# Patient Record
Sex: Female | Born: 1970 | Race: White | Hispanic: No | Marital: Married | Smoking: Never smoker
Health system: Southern US, Community
[De-identification: ages and names within clinical notes are randomized; demographics above are authoritative.]

## PROBLEM LIST (undated history)

## (undated) DIAGNOSIS — R0602 Shortness of breath: Secondary | ICD-10-CM

## (undated) DIAGNOSIS — D509 Iron deficiency anemia, unspecified: Secondary | ICD-10-CM

## (undated) DIAGNOSIS — R609 Edema, unspecified: Secondary | ICD-10-CM

## (undated) DIAGNOSIS — R140 Abdominal distension (gaseous): Secondary | ICD-10-CM

## (undated) DIAGNOSIS — Z8719 Personal history of other diseases of the digestive system: Secondary | ICD-10-CM

## (undated) DIAGNOSIS — D689 Coagulation defect, unspecified: Secondary | ICD-10-CM

## (undated) DIAGNOSIS — Z86711 Personal history of pulmonary embolism: Secondary | ICD-10-CM

## (undated) DIAGNOSIS — R110 Nausea: Secondary | ICD-10-CM

## (undated) DIAGNOSIS — E785 Hyperlipidemia, unspecified: Secondary | ICD-10-CM

## (undated) DIAGNOSIS — E039 Hypothyroidism, unspecified: Secondary | ICD-10-CM

## (undated) DIAGNOSIS — M7989 Other specified soft tissue disorders: Secondary | ICD-10-CM

## (undated) DIAGNOSIS — K439 Ventral hernia without obstruction or gangrene: Secondary | ICD-10-CM

## (undated) HISTORY — DX: Edema, unspecified: R60.9

## (undated) HISTORY — DX: Personal history of pulmonary embolism: Z86.711

## (undated) HISTORY — DX: Nausea: R11.0

## (undated) HISTORY — DX: Hyperlipidemia, unspecified: E78.5

## (undated) HISTORY — DX: Iron deficiency anemia, unspecified: D50.9

## (undated) HISTORY — DX: Ventral hernia without obstruction or gangrene: K43.9

## (undated) HISTORY — DX: Other specified soft tissue disorders: M79.89

## (undated) HISTORY — PX: UMBILICAL HERNIA REPAIR: SUR1181

## (undated) HISTORY — DX: Abdominal distension (gaseous): R14.0

## (undated) HISTORY — DX: Coagulation defect, unspecified: D68.9

## (undated) HISTORY — DX: Hypothyroidism, unspecified: E03.9

---

## 1997-12-27 ENCOUNTER — Ambulatory Visit (HOSPITAL_COMMUNITY): Admission: RE | Admit: 1997-12-27 | Discharge: 1997-12-27 | Payer: Self-pay | Admitting: *Deleted

## 1998-03-22 ENCOUNTER — Ambulatory Visit (HOSPITAL_COMMUNITY): Admission: RE | Admit: 1998-03-22 | Discharge: 1998-03-22 | Payer: Self-pay | Admitting: *Deleted

## 1998-12-26 ENCOUNTER — Other Ambulatory Visit: Admission: RE | Admit: 1998-12-26 | Discharge: 1998-12-26 | Payer: Self-pay | Admitting: *Deleted

## 2000-01-05 ENCOUNTER — Other Ambulatory Visit: Admission: RE | Admit: 2000-01-05 | Discharge: 2000-01-05 | Payer: Self-pay | Admitting: *Deleted

## 2000-12-17 ENCOUNTER — Other Ambulatory Visit: Admission: RE | Admit: 2000-12-17 | Discharge: 2000-12-17 | Payer: Self-pay | Admitting: *Deleted

## 2001-08-17 ENCOUNTER — Emergency Department (HOSPITAL_COMMUNITY): Admission: EM | Admit: 2001-08-17 | Discharge: 2001-08-17 | Payer: Self-pay

## 2001-08-25 ENCOUNTER — Encounter (INDEPENDENT_AMBULATORY_CARE_PROVIDER_SITE_OTHER): Payer: Self-pay | Admitting: Specialist

## 2001-08-25 ENCOUNTER — Encounter: Payer: Self-pay | Admitting: General Surgery

## 2001-08-25 ENCOUNTER — Ambulatory Visit (HOSPITAL_COMMUNITY): Admission: RE | Admit: 2001-08-25 | Discharge: 2001-08-26 | Payer: Self-pay | Admitting: General Surgery

## 2001-09-28 HISTORY — PX: CHOLECYSTECTOMY: SHX55

## 2001-10-03 DIAGNOSIS — Z86711 Personal history of pulmonary embolism: Secondary | ICD-10-CM

## 2001-10-03 HISTORY — DX: Personal history of pulmonary embolism: Z86.711

## 2001-10-06 ENCOUNTER — Ambulatory Visit (HOSPITAL_COMMUNITY): Admission: RE | Admit: 2001-10-06 | Discharge: 2001-10-06 | Payer: Self-pay | Admitting: Endocrinology

## 2001-10-07 ENCOUNTER — Encounter: Payer: Self-pay | Admitting: Endocrinology

## 2001-10-17 ENCOUNTER — Encounter: Payer: Self-pay | Admitting: Endocrinology

## 2001-10-17 ENCOUNTER — Ambulatory Visit (HOSPITAL_COMMUNITY): Admission: RE | Admit: 2001-10-17 | Discharge: 2001-10-17 | Payer: Self-pay | Admitting: Endocrinology

## 2001-10-25 ENCOUNTER — Encounter: Payer: Self-pay | Admitting: Emergency Medicine

## 2001-10-25 ENCOUNTER — Encounter (INDEPENDENT_AMBULATORY_CARE_PROVIDER_SITE_OTHER): Payer: Self-pay | Admitting: *Deleted

## 2001-10-25 ENCOUNTER — Inpatient Hospital Stay (HOSPITAL_COMMUNITY): Admission: EM | Admit: 2001-10-25 | Discharge: 2001-11-04 | Payer: Self-pay | Admitting: Nurse Practitioner

## 2001-10-26 ENCOUNTER — Encounter: Payer: Self-pay | Admitting: Family Medicine

## 2001-10-28 ENCOUNTER — Encounter: Payer: Self-pay | Admitting: Thoracic Surgery

## 2001-11-19 ENCOUNTER — Encounter: Payer: Self-pay | Admitting: Thoracic Surgery

## 2001-11-19 ENCOUNTER — Encounter: Admission: RE | Admit: 2001-11-19 | Discharge: 2001-11-19 | Payer: Self-pay | Admitting: Thoracic Surgery

## 2002-01-12 ENCOUNTER — Encounter: Admission: RE | Admit: 2002-01-12 | Discharge: 2002-01-12 | Payer: Self-pay | Admitting: Thoracic Surgery

## 2002-01-12 ENCOUNTER — Encounter: Payer: Self-pay | Admitting: Thoracic Surgery

## 2002-04-10 ENCOUNTER — Other Ambulatory Visit: Admission: RE | Admit: 2002-04-10 | Discharge: 2002-04-10 | Payer: Self-pay | Admitting: *Deleted

## 2003-01-15 ENCOUNTER — Encounter: Payer: Self-pay | Admitting: *Deleted

## 2003-01-15 ENCOUNTER — Encounter: Admission: RE | Admit: 2003-01-15 | Discharge: 2003-01-15 | Payer: Self-pay | Admitting: *Deleted

## 2003-03-09 ENCOUNTER — Encounter: Admission: RE | Admit: 2003-03-09 | Discharge: 2003-03-09 | Payer: Self-pay | Admitting: *Deleted

## 2003-03-19 ENCOUNTER — Inpatient Hospital Stay (HOSPITAL_COMMUNITY): Admission: AD | Admit: 2003-03-19 | Discharge: 2003-03-19 | Payer: Self-pay | Admitting: *Deleted

## 2003-04-26 ENCOUNTER — Inpatient Hospital Stay (HOSPITAL_COMMUNITY): Admission: AD | Admit: 2003-04-26 | Discharge: 2003-04-29 | Payer: Self-pay | Admitting: Obstetrics and Gynecology

## 2003-05-31 ENCOUNTER — Other Ambulatory Visit: Admission: RE | Admit: 2003-05-31 | Discharge: 2003-05-31 | Payer: Self-pay | Admitting: *Deleted

## 2004-03-13 ENCOUNTER — Ambulatory Visit: Payer: Self-pay | Admitting: Oncology

## 2004-05-08 ENCOUNTER — Ambulatory Visit: Payer: Self-pay | Admitting: Oncology

## 2004-06-26 ENCOUNTER — Ambulatory Visit: Payer: Self-pay | Admitting: Oncology

## 2004-08-21 ENCOUNTER — Ambulatory Visit: Payer: Self-pay | Admitting: Oncology

## 2004-10-02 ENCOUNTER — Other Ambulatory Visit: Admission: RE | Admit: 2004-10-02 | Discharge: 2004-10-02 | Payer: Self-pay | Admitting: Obstetrics and Gynecology

## 2004-10-16 ENCOUNTER — Ambulatory Visit: Payer: Self-pay | Admitting: Oncology

## 2004-12-25 ENCOUNTER — Ambulatory Visit: Payer: Self-pay | Admitting: Oncology

## 2005-02-19 ENCOUNTER — Ambulatory Visit: Payer: Self-pay | Admitting: Oncology

## 2005-04-19 ENCOUNTER — Ambulatory Visit: Payer: Self-pay | Admitting: Oncology

## 2005-06-25 ENCOUNTER — Ambulatory Visit: Payer: Self-pay | Admitting: Oncology

## 2005-09-07 ENCOUNTER — Ambulatory Visit: Payer: Self-pay | Admitting: Oncology

## 2005-09-11 LAB — CBC WITH DIFFERENTIAL/PLATELET
BASO%: 0.9 % (ref 0.0–2.0)
Basophils Absolute: 0.1 10*3/uL (ref 0.0–0.1)
EOS%: 4.3 % (ref 0.0–7.0)
HGB: 12.9 g/dL (ref 11.6–15.9)
MCH: 28.1 pg (ref 26.0–34.0)
MCHC: 35.6 g/dL (ref 32.0–36.0)
RDW: 13.4 % (ref 11.3–14.5)
lymph#: 2.2 10*3/uL (ref 0.9–3.3)

## 2005-09-11 LAB — PROTIME-INR: INR: 1.5 — ABNORMAL LOW (ref 2.00–3.50)

## 2005-10-03 LAB — PROTIME-INR: Protime: 18.1 Seconds — ABNORMAL HIGH (ref 10.6–13.4)

## 2005-10-22 ENCOUNTER — Ambulatory Visit: Payer: Self-pay | Admitting: Oncology

## 2005-10-25 LAB — PROTIME-INR: INR: 2 (ref 2.00–3.50)

## 2005-10-28 ENCOUNTER — Emergency Department (HOSPITAL_COMMUNITY): Admission: EM | Admit: 2005-10-28 | Discharge: 2005-10-28 | Payer: Self-pay | Admitting: Emergency Medicine

## 2005-11-23 LAB — CBC WITH DIFFERENTIAL/PLATELET
BASO%: 1 % (ref 0.0–2.0)
MCHC: 33.4 g/dL (ref 32.0–36.0)
MONO#: 0.7 10*3/uL (ref 0.1–0.9)
RBC: 4.95 10*6/uL (ref 3.70–5.32)
WBC: 8.6 10*3/uL (ref 3.9–10.0)
lymph#: 2.2 10*3/uL (ref 0.9–3.3)

## 2005-11-23 LAB — PROTIME-INR: Protime: 18.9 Seconds — ABNORMAL HIGH (ref 10.6–13.4)

## 2005-12-14 ENCOUNTER — Ambulatory Visit: Payer: Self-pay | Admitting: Oncology

## 2005-12-21 LAB — CBC WITH DIFFERENTIAL/PLATELET
Basophils Absolute: 0.1 10*3/uL (ref 0.0–0.1)
EOS%: 4.6 % (ref 0.0–7.0)
Eosinophils Absolute: 0.5 10*3/uL (ref 0.0–0.5)
LYMPH%: 20.6 % (ref 14.0–48.0)
MCH: 26.7 pg (ref 26.0–34.0)
MCV: 78.4 fL — ABNORMAL LOW (ref 81.0–101.0)
MONO%: 6.4 % (ref 0.0–13.0)
NEUT#: 7.4 10*3/uL — ABNORMAL HIGH (ref 1.5–6.5)
Platelets: 341 10*3/uL (ref 145–400)
RBC: 5.08 10*6/uL (ref 3.70–5.32)
RDW: 16.4 % — ABNORMAL HIGH (ref 11.3–14.5)

## 2005-12-21 LAB — D-DIMER, QUANTITATIVE: D-Dimer, Quant: 0.1 ug/mL-FEU (ref 0.00–0.48)

## 2005-12-21 LAB — PROTHROMBIN TIME: Prothrombin Time: 15.5 seconds — ABNORMAL HIGH (ref 11.6–15.2)

## 2005-12-28 LAB — PROTIME-INR: INR: 1.7 — ABNORMAL LOW (ref 2.00–3.50)

## 2006-01-21 LAB — CBC WITH DIFFERENTIAL/PLATELET
Basophils Absolute: 0.1 10*3/uL (ref 0.0–0.1)
EOS%: 4.1 % (ref 0.0–7.0)
Eosinophils Absolute: 0.4 10*3/uL (ref 0.0–0.5)
HGB: 14 g/dL (ref 11.6–15.9)
LYMPH%: 24.3 % (ref 14.0–48.0)
MCH: 26.5 pg (ref 26.0–34.0)
MCV: 76.2 fL — ABNORMAL LOW (ref 81.0–101.0)
MONO%: 5.9 % (ref 0.0–13.0)
Platelets: 354 10*3/uL (ref 145–400)
RDW: 13.1 % (ref 11.3–14.5)

## 2006-01-21 LAB — PROTIME-INR: INR: 2 (ref 2.00–3.50)

## 2006-02-13 ENCOUNTER — Ambulatory Visit: Payer: Self-pay | Admitting: Oncology

## 2006-03-18 LAB — CBC WITH DIFFERENTIAL/PLATELET
EOS%: 4 % (ref 0.0–7.0)
LYMPH%: 24.2 % (ref 14.0–48.0)
MCH: 26.4 pg (ref 26.0–34.0)
MCHC: 33.2 g/dL (ref 32.0–36.0)
MCV: 79.4 fL — ABNORMAL LOW (ref 81.0–101.0)
MONO%: 6.6 % (ref 0.0–13.0)
Platelets: 289 10*3/uL (ref 145–400)
RBC: 5.27 10*6/uL (ref 3.70–5.32)
RDW: 12.9 % (ref 11.3–14.5)

## 2006-03-18 LAB — PROTIME-INR
INR: 1.3 — ABNORMAL LOW (ref 2.00–3.50)
Protime: 15.6 Seconds — ABNORMAL HIGH (ref 10.6–13.4)

## 2006-03-29 ENCOUNTER — Ambulatory Visit: Payer: Self-pay | Admitting: Oncology

## 2006-04-02 LAB — PROTIME-INR: Protime: 18 Seconds — ABNORMAL HIGH (ref 10.6–13.4)

## 2006-04-16 LAB — PROTIME-INR
INR: 3.4 (ref 2.00–3.50)
Protime: 40.8 Seconds — ABNORMAL HIGH (ref 10.6–13.4)

## 2006-04-29 LAB — PROTIME-INR: INR: 2.8 (ref 2.00–3.50)

## 2006-05-16 ENCOUNTER — Ambulatory Visit: Payer: Self-pay | Admitting: Oncology

## 2006-05-21 LAB — CBC WITH DIFFERENTIAL/PLATELET
BASO%: 0.7 % (ref 0.0–2.0)
Basophils Absolute: 0.1 10*3/uL (ref 0.0–0.1)
EOS%: 4.1 % (ref 0.0–7.0)
Eosinophils Absolute: 0.4 10*3/uL (ref 0.0–0.5)
HCT: 40.9 % (ref 34.8–46.6)
HGB: 14 g/dL (ref 11.6–15.9)
LYMPH%: 22.8 % (ref 14.0–48.0)
MCH: 27.5 pg (ref 26.0–34.0)
MCHC: 34.3 g/dL (ref 32.0–36.0)
MCV: 80 fL — ABNORMAL LOW (ref 81.0–101.0)
MONO#: 0.5 10*3/uL (ref 0.1–0.9)
MONO%: 5.9 % (ref 0.0–13.0)
NEUT#: 6.1 10*3/uL (ref 1.5–6.5)
NEUT%: 66.5 % (ref 39.6–76.8)
Platelets: 299 10*3/uL (ref 145–400)
RBC: 5.12 10*6/uL (ref 3.70–5.32)
RDW: 15.6 % — ABNORMAL HIGH (ref 11.3–14.5)
WBC: 9.2 10*3/uL (ref 3.9–10.0)
lymph#: 2.1 10*3/uL (ref 0.9–3.3)

## 2006-05-21 LAB — PROTIME-INR
INR: 1.5 — ABNORMAL LOW (ref 2.00–3.50)
Protime: 18 Seconds — ABNORMAL HIGH (ref 10.6–13.4)

## 2006-05-28 LAB — PROTIME-INR
INR: 2.8 (ref 2.00–3.50)
Protime: 33.6 Seconds — ABNORMAL HIGH (ref 10.6–13.4)

## 2006-07-10 ENCOUNTER — Ambulatory Visit: Payer: Self-pay | Admitting: Oncology

## 2006-07-15 LAB — COMPREHENSIVE METABOLIC PANEL
ALT: 26 U/L (ref 0–35)
Alkaline Phosphatase: 78 U/L (ref 39–117)
CO2: 23 mEq/L (ref 19–32)
Creatinine, Ser: 0.7 mg/dL (ref 0.40–1.20)
Sodium: 137 mEq/L (ref 135–145)
Total Bilirubin: 0.7 mg/dL (ref 0.3–1.2)
Total Protein: 7.5 g/dL (ref 6.0–8.3)

## 2006-07-15 LAB — CBC WITH DIFFERENTIAL/PLATELET
BASO%: 0.6 % (ref 0.0–2.0)
EOS%: 3.5 % (ref 0.0–7.0)
HCT: 40.3 % (ref 34.8–46.6)
MCH: 27.3 pg (ref 26.0–34.0)
MCHC: 34.4 g/dL (ref 32.0–36.0)
MONO#: 0.6 10*3/uL (ref 0.1–0.9)
RBC: 5.08 10*6/uL (ref 3.70–5.32)
RDW: 15.6 % — ABNORMAL HIGH (ref 11.3–14.5)
WBC: 10 10*3/uL (ref 3.9–10.0)
lymph#: 2.3 10*3/uL (ref 0.9–3.3)

## 2006-07-15 LAB — PROTIME-INR: Protime: 25.2 Seconds — ABNORMAL HIGH (ref 10.6–13.4)

## 2006-08-06 LAB — CBC WITH DIFFERENTIAL/PLATELET
BASO%: 1.3 % (ref 0.0–2.0)
Basophils Absolute: 0.1 10*3/uL (ref 0.0–0.1)
EOS%: 3.7 % (ref 0.0–7.0)
Eosinophils Absolute: 0.4 10*3/uL (ref 0.0–0.5)
HCT: 40.3 % (ref 34.8–46.6)
HGB: 14.2 g/dL (ref 11.6–15.9)
LYMPH%: 22.1 % (ref 14.0–48.0)
MCH: 28.1 pg (ref 26.0–34.0)
MCHC: 35.3 g/dL (ref 32.0–36.0)
MCV: 79.8 fL — ABNORMAL LOW (ref 81.0–101.0)
MONO#: 0.6 10*3/uL (ref 0.1–0.9)
MONO%: 6.3 % (ref 0.0–13.0)
NEUT#: 6.5 10*3/uL (ref 1.5–6.5)
NEUT%: 66.6 % (ref 39.6–76.8)
Platelets: 300 10*3/uL (ref 145–400)
RBC: 5.06 10*6/uL (ref 3.70–5.32)
RDW: 15.2 % — ABNORMAL HIGH (ref 11.3–14.5)
WBC: 9.7 10*3/uL (ref 3.9–10.0)
lymph#: 2.2 10*3/uL (ref 0.9–3.3)

## 2006-08-09 LAB — HEPARIN ANTI-XA: Heparin LMW: 0.89 IU/mL

## 2006-09-05 ENCOUNTER — Ambulatory Visit: Payer: Self-pay | Admitting: Oncology

## 2006-09-05 LAB — CBC WITH DIFFERENTIAL/PLATELET
BASO%: 1.1 % (ref 0.0–2.0)
Eosinophils Absolute: 0.3 10*3/uL (ref 0.0–0.5)
HCT: 40.4 % (ref 34.8–46.6)
LYMPH%: 23 % (ref 14.0–48.0)
MONO#: 0.6 10*3/uL (ref 0.1–0.9)
NEUT#: 6.7 10*3/uL — ABNORMAL HIGH (ref 1.5–6.5)
Platelets: 317 10*3/uL (ref 145–400)
RBC: 5.04 10*6/uL (ref 3.70–5.32)
WBC: 10 10*3/uL (ref 3.9–10.0)
lymph#: 2.3 10*3/uL (ref 0.9–3.3)

## 2006-09-05 LAB — HEPARIN ANTI-XA: Heparin LMW: 0.93 IU/mL

## 2006-10-03 LAB — CBC WITH DIFFERENTIAL/PLATELET
Basophils Absolute: 0 10*3/uL (ref 0.0–0.1)
EOS%: 4.8 % (ref 0.0–7.0)
Eosinophils Absolute: 0.4 10*3/uL (ref 0.0–0.5)
HCT: 38.3 % (ref 34.8–46.6)
HGB: 13.5 g/dL (ref 11.6–15.9)
MCH: 28.3 pg (ref 26.0–34.0)
MONO#: 0.6 10*3/uL (ref 0.1–0.9)
NEUT#: 5.8 10*3/uL (ref 1.5–6.5)
NEUT%: 63.7 % (ref 39.6–76.8)
RDW: 14.1 % (ref 11.3–14.5)
lymph#: 2.2 10*3/uL (ref 0.9–3.3)

## 2006-11-07 ENCOUNTER — Ambulatory Visit: Payer: Self-pay | Admitting: Oncology

## 2006-11-11 LAB — CBC WITH DIFFERENTIAL/PLATELET
EOS%: 4.2 % (ref 0.0–7.0)
Eosinophils Absolute: 0.4 10*3/uL (ref 0.0–0.5)
HGB: 13.7 g/dL (ref 11.6–15.9)
MCV: 80.7 fL — ABNORMAL LOW (ref 81.0–101.0)
MONO%: 5.3 % (ref 0.0–13.0)
NEUT#: 6.6 10*3/uL — ABNORMAL HIGH (ref 1.5–6.5)
RBC: 4.86 10*6/uL (ref 3.70–5.32)
RDW: 14.1 % (ref 11.3–14.5)
lymph#: 2.4 10*3/uL (ref 0.9–3.3)

## 2006-11-12 LAB — HEPARIN ANTI-XA: Heparin LMW: 0.71 IU/mL

## 2006-12-24 ENCOUNTER — Ambulatory Visit: Payer: Self-pay | Admitting: Oncology

## 2007-01-01 LAB — CBC WITH DIFFERENTIAL/PLATELET
BASO%: 0.5 % (ref 0.0–2.0)
Eosinophils Absolute: 0.4 10*3/uL (ref 0.0–0.5)
HCT: 39.2 % (ref 34.8–46.6)
MCHC: 35.5 g/dL (ref 32.0–36.0)
MONO#: 0.5 10*3/uL (ref 0.1–0.9)
NEUT#: 5.8 10*3/uL (ref 1.5–6.5)
RBC: 4.89 10*6/uL (ref 3.70–5.32)
WBC: 9 10*3/uL (ref 3.9–10.0)
lymph#: 2.4 10*3/uL (ref 0.9–3.3)

## 2007-01-01 LAB — HEPARIN ANTI-XA: Heparin LMW: 0.99 IU/mL

## 2007-01-20 LAB — CBC WITH DIFFERENTIAL/PLATELET
Basophils Absolute: 0 10*3/uL (ref 0.0–0.1)
EOS%: 4 % (ref 0.0–7.0)
Eosinophils Absolute: 0.4 10*3/uL (ref 0.0–0.5)
HGB: 13.9 g/dL (ref 11.6–15.9)
MONO#: 0.6 10*3/uL (ref 0.1–0.9)
NEUT#: 5.6 10*3/uL (ref 1.5–6.5)
RDW: 14.5 % (ref 11.3–14.5)
lymph#: 2.5 10*3/uL (ref 0.9–3.3)

## 2007-02-13 ENCOUNTER — Ambulatory Visit: Payer: Self-pay | Admitting: Oncology

## 2007-02-17 LAB — CBC WITH DIFFERENTIAL/PLATELET
BASO%: 0.6 % (ref 0.0–2.0)
Basophils Absolute: 0.1 10*3/uL (ref 0.0–0.1)
EOS%: 4.2 % (ref 0.0–7.0)
HCT: 40.8 % (ref 34.8–46.6)
HGB: 14.4 g/dL (ref 11.6–15.9)
MCH: 28.2 pg (ref 26.0–34.0)
MCHC: 35.3 g/dL (ref 32.0–36.0)
MCV: 79.9 fL — ABNORMAL LOW (ref 81.0–101.0)
MONO%: 5.4 % (ref 0.0–13.0)
NEUT%: 61.1 % (ref 39.6–76.8)
RDW: 14.4 % (ref 11.3–14.5)

## 2007-03-17 LAB — HEPARIN ANTI-XA: Heparin LMW: 0.72 IU/mL

## 2007-03-17 LAB — CBC WITH DIFFERENTIAL/PLATELET
BASO%: 0.6 % (ref 0.0–2.0)
EOS%: 3.1 % (ref 0.0–7.0)
LYMPH%: 26.1 % (ref 14.0–48.0)
MCHC: 35.5 g/dL (ref 32.0–36.0)
MCV: 80.6 fL — ABNORMAL LOW (ref 81.0–101.0)
MONO%: 4.9 % (ref 0.0–13.0)
Platelets: 294 10*3/uL (ref 145–400)
RBC: 4.95 10*6/uL (ref 3.70–5.32)

## 2007-04-10 ENCOUNTER — Ambulatory Visit: Payer: Self-pay | Admitting: Oncology

## 2007-04-14 LAB — CBC WITH DIFFERENTIAL/PLATELET
BASO%: 0.7 % (ref 0.0–2.0)
EOS%: 3.4 % (ref 0.0–7.0)
MCH: 28.3 pg (ref 26.0–34.0)
MCHC: 34.8 g/dL (ref 32.0–36.0)
NEUT%: 69.8 % (ref 39.6–76.8)
RBC: 5.02 10*6/uL (ref 3.70–5.32)
RDW: 14.9 % — ABNORMAL HIGH (ref 11.3–14.5)
lymph#: 2.1 10*3/uL (ref 0.9–3.3)

## 2007-04-14 LAB — HEPARIN ANTI-XA: Heparin LMW: 0.79 IU/mL

## 2007-06-16 ENCOUNTER — Ambulatory Visit: Payer: Self-pay | Admitting: Oncology

## 2007-06-18 LAB — CBC WITH DIFFERENTIAL/PLATELET
BASO%: 0.5 % (ref 0.0–2.0)
EOS%: 4.9 % (ref 0.0–7.0)
LYMPH%: 28 % (ref 14.0–48.0)
MCHC: 35 g/dL (ref 32.0–36.0)
MCV: 81.7 fL (ref 81.0–101.0)
MONO%: 5.9 % (ref 0.0–13.0)
Platelets: 284 10*3/uL (ref 145–400)
RBC: 5.11 10*6/uL (ref 3.70–5.32)
RDW: 14.4 % (ref 11.3–14.5)
WBC: 7.9 10*3/uL (ref 3.9–10.0)

## 2007-07-30 ENCOUNTER — Ambulatory Visit: Payer: Self-pay | Admitting: Oncology

## 2007-08-06 LAB — PROTIME-INR: Protime: 20.4 Seconds — ABNORMAL HIGH (ref 10.6–13.4)

## 2007-08-13 LAB — PROTIME-INR
INR: 1.9 — ABNORMAL LOW (ref 2.00–3.50)
Protime: 22.8 Seconds — ABNORMAL HIGH (ref 10.6–13.4)

## 2007-08-18 ENCOUNTER — Ambulatory Visit (HOSPITAL_COMMUNITY): Admission: RE | Admit: 2007-08-18 | Discharge: 2007-08-18 | Payer: Self-pay | Admitting: Obstetrics and Gynecology

## 2007-08-29 LAB — PROTIME-INR
INR: 3.7 — ABNORMAL HIGH (ref 2.00–3.50)
Protime: 44.4 Seconds — ABNORMAL HIGH (ref 10.6–13.4)

## 2007-09-10 ENCOUNTER — Ambulatory Visit: Payer: Self-pay | Admitting: Oncology

## 2007-09-12 LAB — CBC WITH DIFFERENTIAL/PLATELET
BASO%: 1.1 % (ref 0.0–2.0)
Basophils Absolute: 0.1 10*3/uL (ref 0.0–0.1)
EOS%: 5.5 % (ref 0.0–7.0)
HCT: 41.5 % (ref 34.8–46.6)
HGB: 14.1 g/dL (ref 11.6–15.9)
LYMPH%: 23.1 % (ref 14.0–48.0)
MCH: 27.4 pg (ref 26.0–34.0)
MCHC: 34 g/dL (ref 32.0–36.0)
NEUT%: 64.3 % (ref 39.6–76.8)
Platelets: 276 10*3/uL (ref 145–400)
lymph#: 1.9 10*3/uL (ref 0.9–3.3)

## 2007-09-12 LAB — PROTIME-INR

## 2007-09-26 LAB — PROTIME-INR

## 2007-10-09 LAB — PROTIME-INR: INR: 3.4 (ref 2.00–3.50)

## 2007-10-22 ENCOUNTER — Ambulatory Visit: Payer: Self-pay | Admitting: Oncology

## 2007-10-24 LAB — PROTIME-INR: INR: 2.2 (ref 2.00–3.50)

## 2007-11-21 LAB — CBC WITH DIFFERENTIAL/PLATELET
BASO%: 0.7 % (ref 0.0–2.0)
EOS%: 4.2 % (ref 0.0–7.0)
LYMPH%: 25 % (ref 14.0–48.0)
MCH: 27.4 pg (ref 26.0–34.0)
MCHC: 34.3 g/dL (ref 32.0–36.0)
MCV: 80 fL — ABNORMAL LOW (ref 81.0–101.0)
MONO%: 7.2 % (ref 0.0–13.0)
Platelets: 295 10*3/uL (ref 145–400)
RBC: 4.95 10*6/uL (ref 3.70–5.32)
RDW: 14 % (ref 11.3–14.5)

## 2007-11-21 LAB — PROTIME-INR
INR: 3.1 (ref 2.00–3.50)
Protime: 37.2 Seconds — ABNORMAL HIGH (ref 10.6–13.4)

## 2007-12-14 ENCOUNTER — Ambulatory Visit: Payer: Self-pay | Admitting: Oncology

## 2007-12-18 LAB — PROTIME-INR
INR: 2.7 (ref 2.00–3.50)
Protime: 32.4 Seconds — ABNORMAL HIGH (ref 10.6–13.4)

## 2007-12-26 LAB — PROTIME-INR
INR: 4.2 — ABNORMAL HIGH (ref 2.00–3.50)
Protime: 50.4 Seconds — ABNORMAL HIGH (ref 10.6–13.4)

## 2008-01-16 LAB — PROTIME-INR

## 2008-01-21 ENCOUNTER — Encounter: Admission: RE | Admit: 2008-01-21 | Discharge: 2008-01-21 | Payer: Self-pay | Admitting: Family Medicine

## 2008-02-11 ENCOUNTER — Ambulatory Visit: Payer: Self-pay | Admitting: Oncology

## 2008-03-08 LAB — CBC WITH DIFFERENTIAL/PLATELET
Basophils Absolute: 0 10*3/uL (ref 0.0–0.1)
EOS%: 3.4 % (ref 0.0–7.0)
Eosinophils Absolute: 0.3 10*3/uL (ref 0.0–0.5)
HCT: 40.4 % (ref 34.8–46.6)
HGB: 14.1 g/dL (ref 11.6–15.9)
MCH: 29.1 pg (ref 26.0–34.0)
NEUT#: 6.2 10*3/uL (ref 1.5–6.5)
NEUT%: 68.6 % (ref 39.6–76.8)
lymph#: 2 10*3/uL (ref 0.9–3.3)

## 2008-03-08 LAB — HEPARIN ANTI-XA: Heparin LMW: 0.69 IU/mL

## 2008-03-09 LAB — COMPREHENSIVE METABOLIC PANEL
Albumin: 4.2 g/dL (ref 3.5–5.2)
BUN: 9 mg/dL (ref 6–23)
CO2: 21 mEq/L (ref 19–32)
Calcium: 8.8 mg/dL (ref 8.4–10.5)
Chloride: 103 mEq/L (ref 96–112)
Creatinine, Ser: 0.66 mg/dL (ref 0.40–1.20)
Glucose, Bld: 91 mg/dL (ref 70–99)

## 2008-03-09 LAB — LACTATE DEHYDROGENASE: LDH: 118 U/L (ref 94–250)

## 2008-03-09 LAB — D-DIMER, QUANTITATIVE: D-Dimer, Quant: 0.22 ug/mL-FEU (ref 0.00–0.48)

## 2008-03-09 LAB — FIBRINOGEN: Fibrinogen: 556 mg/dL — ABNORMAL HIGH (ref 204–475)

## 2008-04-01 ENCOUNTER — Ambulatory Visit: Payer: Self-pay | Admitting: Oncology

## 2008-04-05 LAB — CBC WITH DIFFERENTIAL/PLATELET
Eosinophils Absolute: 0.3 10*3/uL (ref 0.0–0.5)
MONO#: 0.5 10*3/uL (ref 0.1–0.9)
MONO%: 5.7 % (ref 0.0–13.0)
NEUT#: 6.7 10*3/uL — ABNORMAL HIGH (ref 1.5–6.5)
RBC: 5.17 10*6/uL (ref 3.70–5.32)
RDW: 15.1 % — ABNORMAL HIGH (ref 11.3–14.5)
WBC: 9.6 10*3/uL (ref 3.9–10.0)
lymph#: 2.1 10*3/uL (ref 0.9–3.3)

## 2008-04-05 LAB — HEPARIN ANTI-XA: Heparin LMW: 1.01 IU/mL

## 2008-05-03 ENCOUNTER — Ambulatory Visit: Payer: Self-pay | Admitting: Oncology

## 2008-05-03 LAB — PROTHROMBIN TIME
INR: 1 (ref 0.0–1.5)
Prothrombin Time: 13.4 seconds (ref 11.6–15.2)

## 2008-05-03 LAB — CBC WITH DIFFERENTIAL/PLATELET
Basophils Absolute: 0.1 10*3/uL (ref 0.0–0.1)
Eosinophils Absolute: 0.2 10*3/uL (ref 0.0–0.5)
HGB: 14.9 g/dL (ref 11.6–15.9)
MONO#: 0.4 10*3/uL (ref 0.1–0.9)
NEUT#: 6.8 10*3/uL — ABNORMAL HIGH (ref 1.5–6.5)
Platelets: 281 10*3/uL (ref 145–400)
RBC: 5.02 10*6/uL (ref 3.70–5.32)
RDW: 13.8 % (ref 11.3–14.5)
WBC: 9 10*3/uL (ref 3.9–10.0)

## 2008-05-03 LAB — HEPARIN ANTI-XA: Heparin LMW: 0.84 IU/mL

## 2008-05-31 LAB — CBC WITH DIFFERENTIAL/PLATELET
Basophils Absolute: 0 10*3/uL (ref 0.0–0.1)
Eosinophils Absolute: 0.2 10*3/uL (ref 0.0–0.5)
HCT: 40.6 % (ref 34.8–46.6)
HGB: 14.4 g/dL (ref 11.6–15.9)
MCH: 30.6 pg (ref 26.0–34.0)
MONO#: 0.5 10*3/uL (ref 0.1–0.9)
NEUT#: 8 10*3/uL — ABNORMAL HIGH (ref 1.5–6.5)
NEUT%: 77.1 % — ABNORMAL HIGH (ref 39.6–76.8)
WBC: 10.4 10*3/uL — ABNORMAL HIGH (ref 3.9–10.0)
lymph#: 1.6 10*3/uL (ref 0.9–3.3)

## 2008-06-24 ENCOUNTER — Ambulatory Visit: Payer: Self-pay | Admitting: Oncology

## 2008-07-01 LAB — CBC WITH DIFFERENTIAL/PLATELET
BASO%: 0.7 % (ref 0.0–2.0)
Eosinophils Absolute: 0.2 10*3/uL (ref 0.0–0.5)
HCT: 38.7 % (ref 34.8–46.6)
MCHC: 35.1 g/dL (ref 31.5–36.0)
MONO#: 0.4 10*3/uL (ref 0.1–0.9)
NEUT#: 7.1 10*3/uL — ABNORMAL HIGH (ref 1.5–6.5)
Platelets: 206 10*3/uL (ref 145–400)
RBC: 4.43 10*6/uL (ref 3.70–5.45)
WBC: 9.3 10*3/uL (ref 3.9–10.3)
lymph#: 1.6 10*3/uL (ref 0.9–3.3)

## 2008-07-01 LAB — PROTIME-INR: Protime: 12 Seconds (ref 10.6–13.4)

## 2008-07-26 LAB — CBC WITH DIFFERENTIAL/PLATELET
BASO%: 0.2 % (ref 0.0–2.0)
HCT: 39.7 % (ref 34.8–46.6)
LYMPH%: 14 % (ref 14.0–49.7)
MCHC: 34.9 g/dL (ref 31.5–36.0)
MONO#: 0.3 10*3/uL (ref 0.1–0.9)
NEUT%: 80.9 % — ABNORMAL HIGH (ref 38.4–76.8)
Platelets: 185 10*3/uL (ref 145–400)
WBC: 9.8 10*3/uL (ref 3.9–10.3)

## 2008-07-26 LAB — HEPARIN ANTI-XA: Heparin LMW: 0.6 IU/mL

## 2008-08-09 ENCOUNTER — Ambulatory Visit: Payer: Self-pay | Admitting: Oncology

## 2008-08-23 LAB — CBC WITH DIFFERENTIAL/PLATELET
Basophils Absolute: 0 10*3/uL (ref 0.0–0.1)
Eosinophils Absolute: 0.1 10*3/uL (ref 0.0–0.5)
HCT: 38 % (ref 34.8–46.6)
HGB: 13.4 g/dL (ref 11.6–15.9)
LYMPH%: 17.2 % (ref 14.0–49.7)
MCV: 87.2 fL (ref 79.5–101.0)
MONO#: 0.4 10*3/uL (ref 0.1–0.9)
MONO%: 5.4 % (ref 0.0–14.0)
NEUT#: 6.2 10*3/uL (ref 1.5–6.5)
Platelets: 190 10*3/uL (ref 145–400)
RBC: 4.36 10*6/uL (ref 3.70–5.45)
WBC: 8.3 10*3/uL (ref 3.9–10.3)

## 2008-08-23 LAB — HEPARIN ANTI-XA: Heparin LMW: 0.66 IU/mL

## 2008-08-23 LAB — PROTIME-INR: Protime: 10.8 Seconds (ref 10.6–13.4)

## 2008-08-24 ENCOUNTER — Encounter: Admission: RE | Admit: 2008-08-24 | Discharge: 2008-08-24 | Payer: Self-pay | Admitting: Obstetrics and Gynecology

## 2008-09-15 ENCOUNTER — Inpatient Hospital Stay (HOSPITAL_COMMUNITY): Admission: AD | Admit: 2008-09-15 | Discharge: 2008-09-15 | Payer: Self-pay | Admitting: Obstetrics and Gynecology

## 2008-09-20 LAB — CBC WITH DIFFERENTIAL/PLATELET
BASO%: 0.4 % (ref 0.0–2.0)
EOS%: 1.2 % (ref 0.0–7.0)
MCH: 30.1 pg (ref 25.1–34.0)
MCV: 87.3 fL (ref 79.5–101.0)
MONO%: 5.3 % (ref 0.0–14.0)
RBC: 4.65 10*6/uL (ref 3.70–5.45)
RDW: 14 % (ref 11.2–14.5)

## 2008-09-20 LAB — PROTIME-INR
INR: 0.9 — ABNORMAL LOW (ref 2.00–3.50)
Protime: 10.8 Seconds (ref 10.6–13.4)

## 2008-10-14 ENCOUNTER — Ambulatory Visit: Payer: Self-pay | Admitting: Oncology

## 2008-10-18 LAB — CBC WITH DIFFERENTIAL/PLATELET
Eosinophils Absolute: 0.1 10*3/uL (ref 0.0–0.5)
HCT: 43 % (ref 34.8–46.6)
LYMPH%: 21.8 % (ref 14.0–49.7)
MCV: 87 fL (ref 79.5–101.0)
MONO#: 0.3 10*3/uL (ref 0.1–0.9)
NEUT#: 5.8 10*3/uL (ref 1.5–6.5)
NEUT%: 72.5 % (ref 38.4–76.8)
Platelets: 177 10*3/uL (ref 145–400)
WBC: 8 10*3/uL (ref 3.9–10.3)

## 2008-10-21 ENCOUNTER — Inpatient Hospital Stay (HOSPITAL_COMMUNITY): Admission: AD | Admit: 2008-10-21 | Discharge: 2008-10-24 | Payer: Self-pay | Admitting: Obstetrics and Gynecology

## 2008-10-27 LAB — CBC WITH DIFFERENTIAL/PLATELET
EOS%: 2.5 % (ref 0.0–7.0)
Eosinophils Absolute: 0.2 10*3/uL (ref 0.0–0.5)
MCV: 86.1 fL (ref 79.5–101.0)
MONO%: 6.1 % (ref 0.0–14.0)
NEUT#: 5.5 10*3/uL (ref 1.5–6.5)
RBC: 3.82 10*6/uL (ref 3.70–5.45)
RDW: 14.5 % (ref 11.2–14.5)
lymph#: 1.6 10*3/uL (ref 0.9–3.3)

## 2008-10-27 LAB — PROTIME-INR
INR: 1.3 — ABNORMAL LOW (ref 2.00–3.50)
Protime: 15.6 Seconds — ABNORMAL HIGH (ref 10.6–13.4)

## 2008-11-02 LAB — CBC WITH DIFFERENTIAL/PLATELET
BASO%: 0.2 % (ref 0.0–2.0)
EOS%: 2.3 % (ref 0.0–7.0)
HCT: 37.3 % (ref 34.8–46.6)
MCH: 29 pg (ref 25.1–34.0)
MCHC: 34.3 g/dL (ref 31.5–36.0)
NEUT%: 67.7 % (ref 38.4–76.8)
RBC: 4.41 10*6/uL (ref 3.70–5.45)
WBC: 8.6 10*3/uL (ref 3.9–10.3)
lymph#: 1.9 10*3/uL (ref 0.9–3.3)

## 2008-11-02 LAB — PROTIME-INR: Protime: 25.2 Seconds — ABNORMAL HIGH (ref 10.6–13.4)

## 2008-12-30 ENCOUNTER — Ambulatory Visit: Payer: Self-pay | Admitting: Oncology

## 2009-01-04 LAB — CBC WITH DIFFERENTIAL/PLATELET
Basophils Absolute: 0 10*3/uL (ref 0.0–0.1)
Eosinophils Absolute: 0.3 10*3/uL (ref 0.0–0.5)
HCT: 40.1 % (ref 34.8–46.6)
HGB: 13.4 g/dL (ref 11.6–15.9)
LYMPH%: 22.8 % (ref 14.0–49.7)
MCV: 80.4 fL (ref 79.5–101.0)
MONO%: 7.2 % (ref 0.0–14.0)
NEUT#: 5.6 10*3/uL (ref 1.5–6.5)
NEUT%: 66.2 % (ref 38.4–76.8)
Platelets: 279 10*3/uL (ref 145–400)

## 2009-01-04 LAB — PROTIME-INR: Protime: 15.6 Seconds — ABNORMAL HIGH (ref 10.6–13.4)

## 2009-01-24 LAB — PROTIME-INR
INR: 2.3 (ref 2.00–3.50)
Protime: 27.6 Seconds — ABNORMAL HIGH (ref 10.6–13.4)

## 2009-02-24 ENCOUNTER — Ambulatory Visit: Payer: Self-pay | Admitting: Oncology

## 2009-03-01 LAB — PROTIME-INR: Protime: 28.8 Seconds — ABNORMAL HIGH (ref 10.6–13.4)

## 2009-04-26 ENCOUNTER — Ambulatory Visit: Payer: Self-pay | Admitting: Oncology

## 2009-04-28 LAB — CBC WITH DIFFERENTIAL/PLATELET
BASO%: 0.5 % (ref 0.0–2.0)
Basophils Absolute: 0 10*3/uL (ref 0.0–0.1)
EOS%: 5.3 % (ref 0.0–7.0)
HGB: 13.3 g/dL (ref 11.6–15.9)
MCH: 26.5 pg (ref 25.1–34.0)
MCHC: 33.4 g/dL (ref 31.5–36.0)
MCV: 79.4 fL — ABNORMAL LOW (ref 79.5–101.0)
MONO%: 7 % (ref 0.0–14.0)
RBC: 5.01 10*6/uL (ref 3.70–5.45)
RDW: 14.3 % (ref 11.2–14.5)
lymph#: 2.1 10*3/uL (ref 0.9–3.3)

## 2009-04-28 LAB — PROTIME-INR
INR: 1.6 — ABNORMAL LOW (ref 2.00–3.50)
Protime: 19.2 Seconds — ABNORMAL HIGH (ref 10.6–13.4)

## 2009-06-02 ENCOUNTER — Ambulatory Visit: Payer: Self-pay | Admitting: Oncology

## 2009-06-06 LAB — PROTIME-INR
INR: 1.7 — ABNORMAL LOW (ref 2.00–3.50)
Protime: 20.4 Seconds — ABNORMAL HIGH (ref 10.6–13.4)

## 2009-07-04 ENCOUNTER — Ambulatory Visit: Payer: Self-pay | Admitting: Oncology

## 2009-07-04 LAB — PROTIME-INR: Protime: 36 Seconds — ABNORMAL HIGH (ref 10.6–13.4)

## 2009-08-19 ENCOUNTER — Ambulatory Visit: Payer: Self-pay | Admitting: Oncology

## 2009-08-22 LAB — PROTIME-INR: INR: 2.6 (ref 2.00–3.50)

## 2009-10-07 ENCOUNTER — Ambulatory Visit: Payer: Self-pay | Admitting: Oncology

## 2009-10-11 LAB — PROTIME-INR

## 2009-11-08 ENCOUNTER — Ambulatory Visit: Payer: Self-pay | Admitting: Oncology

## 2009-11-08 LAB — CBC WITH DIFFERENTIAL/PLATELET
BASO%: 0.4 % (ref 0.0–2.0)
HGB: 12.3 g/dL (ref 11.6–15.9)
MCH: 26.1 pg (ref 25.1–34.0)
MCHC: 32.7 g/dL (ref 31.5–36.0)
MONO%: 7.5 % (ref 0.0–14.0)
NEUT#: 5 10*3/uL (ref 1.5–6.5)
NEUT%: 60.2 % (ref 38.4–76.8)
RBC: 4.71 10*6/uL (ref 3.70–5.45)
WBC: 8.3 10*3/uL (ref 3.9–10.3)
lymph#: 2.2 10*3/uL (ref 0.9–3.3)

## 2009-11-08 LAB — COMPREHENSIVE METABOLIC PANEL
ALT: 36 U/L — ABNORMAL HIGH (ref 0–35)
Albumin: 3.9 g/dL (ref 3.5–5.2)
Alkaline Phosphatase: 58 U/L (ref 39–117)
BUN: 10 mg/dL (ref 6–23)
CO2: 25 mEq/L (ref 19–32)
Chloride: 102 mEq/L (ref 96–112)
Creatinine, Ser: 0.87 mg/dL (ref 0.40–1.20)
Potassium: 3.4 mEq/L — ABNORMAL LOW (ref 3.5–5.3)
Sodium: 135 mEq/L (ref 135–145)
Total Bilirubin: 0.7 mg/dL (ref 0.3–1.2)
Total Protein: 7.5 g/dL (ref 6.0–8.3)

## 2009-11-08 LAB — LACTATE DEHYDROGENASE: LDH: 109 U/L (ref 94–250)

## 2009-11-08 LAB — PROTIME-INR
INR: 2.6 (ref 2.00–3.50)
Protime: 31.2 Seconds — ABNORMAL HIGH (ref 10.6–13.4)

## 2010-01-03 ENCOUNTER — Ambulatory Visit: Payer: Self-pay | Admitting: Oncology

## 2010-01-03 LAB — PROTIME-INR: Protime: 38.4 Seconds — ABNORMAL HIGH (ref 10.6–13.4)

## 2010-01-03 LAB — CBC WITH DIFFERENTIAL/PLATELET
EOS%: 4.3 % (ref 0.0–7.0)
Eosinophils Absolute: 0.3 10*3/uL (ref 0.0–0.5)
HGB: 12 g/dL (ref 11.6–15.9)
MCH: 25.7 pg (ref 25.1–34.0)
MCHC: 32.7 g/dL (ref 31.5–36.0)
MONO#: 0.5 10*3/uL (ref 0.1–0.9)
MONO%: 6.6 % (ref 0.0–14.0)
NEUT#: 4.9 10*3/uL (ref 1.5–6.5)
NEUT%: 63.1 % (ref 38.4–76.8)
RDW: 14.7 % — ABNORMAL HIGH (ref 11.2–14.5)

## 2010-02-10 ENCOUNTER — Ambulatory Visit: Payer: Self-pay | Admitting: Oncology

## 2010-03-06 LAB — PROTIME-INR
INR: 3.1 (ref 2.00–3.50)
Protime: 37.2 Seconds — ABNORMAL HIGH (ref 10.6–13.4)

## 2010-05-08 LAB — PROTIME-INR
INR: 2 (ref 2.00–3.50)
Protime: 24 Seconds — ABNORMAL HIGH (ref 10.6–13.4)

## 2010-05-21 ENCOUNTER — Encounter: Payer: Self-pay | Admitting: Oncology

## 2010-05-21 ENCOUNTER — Encounter: Payer: Self-pay | Admitting: Obstetrics and Gynecology

## 2010-05-30 ENCOUNTER — Ambulatory Visit: Payer: Self-pay | Admitting: Oncology

## 2010-06-19 ENCOUNTER — Other Ambulatory Visit: Payer: Self-pay | Admitting: Family Medicine

## 2010-06-19 DIAGNOSIS — R609 Edema, unspecified: Secondary | ICD-10-CM

## 2010-06-19 DIAGNOSIS — R52 Pain, unspecified: Secondary | ICD-10-CM

## 2010-06-20 ENCOUNTER — Other Ambulatory Visit: Payer: Self-pay

## 2010-06-23 ENCOUNTER — Ambulatory Visit
Admission: RE | Admit: 2010-06-23 | Discharge: 2010-06-23 | Disposition: A | Discharge status: Home / Self Care | Payer: 59 | Source: Ambulatory Visit | Attending: Family Medicine | Admitting: Family Medicine

## 2010-06-23 DIAGNOSIS — R609 Edema, unspecified: Secondary | ICD-10-CM

## 2010-06-23 DIAGNOSIS — R52 Pain, unspecified: Secondary | ICD-10-CM

## 2010-07-13 ENCOUNTER — Other Ambulatory Visit: Payer: Self-pay | Admitting: Oncology

## 2010-07-13 ENCOUNTER — Encounter (INDEPENDENT_AMBULATORY_CARE_PROVIDER_SITE_OTHER): Payer: 59 | Admitting: Oncology

## 2010-07-13 DIAGNOSIS — Z7901 Long term (current) use of anticoagulants: Secondary | ICD-10-CM

## 2010-07-13 DIAGNOSIS — D6859 Other primary thrombophilia: Secondary | ICD-10-CM

## 2010-07-13 DIAGNOSIS — Z86718 Personal history of other venous thrombosis and embolism: Secondary | ICD-10-CM

## 2010-07-26 ENCOUNTER — Institutional Professional Consult (permissible substitution): Payer: Self-pay | Admitting: Cardiology

## 2010-07-26 ENCOUNTER — Encounter: Payer: Self-pay | Admitting: *Deleted

## 2010-07-26 DIAGNOSIS — E079 Disorder of thyroid, unspecified: Secondary | ICD-10-CM | POA: Insufficient documentation

## 2010-07-26 DIAGNOSIS — E039 Hypothyroidism, unspecified: Secondary | ICD-10-CM | POA: Insufficient documentation

## 2010-07-27 ENCOUNTER — Other Ambulatory Visit: Payer: Self-pay | Admitting: Oncology

## 2010-07-27 ENCOUNTER — Encounter (HOSPITAL_BASED_OUTPATIENT_CLINIC_OR_DEPARTMENT_OTHER): Payer: 59 | Admitting: Oncology

## 2010-07-27 DIAGNOSIS — Z7901 Long term (current) use of anticoagulants: Secondary | ICD-10-CM

## 2010-07-27 DIAGNOSIS — D6859 Other primary thrombophilia: Secondary | ICD-10-CM

## 2010-07-27 DIAGNOSIS — Z86718 Personal history of other venous thrombosis and embolism: Secondary | ICD-10-CM

## 2010-07-27 LAB — PROTIME-INR: Protime: 45.6 Seconds — ABNORMAL HIGH (ref 10.6–13.4)

## 2010-08-01 ENCOUNTER — Institutional Professional Consult (permissible substitution): Payer: Self-pay | Admitting: Cardiology

## 2010-08-07 LAB — CBC
HCT: 29.1 % — ABNORMAL LOW (ref 36.0–46.0)
HCT: 39.9 % (ref 36.0–46.0)
MCHC: 35.1 g/dL (ref 30.0–36.0)
MCV: 88.8 fL (ref 78.0–100.0)
MCV: 89.5 fL (ref 78.0–100.0)
Platelets: 136 10*3/uL — ABNORMAL LOW (ref 150–400)
Platelets: 141 10*3/uL — ABNORMAL LOW (ref 150–400)
Platelets: 163 10*3/uL (ref 150–400)
RBC: 4.59 MIL/uL (ref 3.87–5.11)
RDW: 14.6 % (ref 11.5–15.5)
RDW: 14.6 % (ref 11.5–15.5)
RDW: 14.7 % (ref 11.5–15.5)

## 2010-08-07 LAB — RPR: RPR Ser Ql: NONREACTIVE

## 2010-08-09 ENCOUNTER — Encounter (HOSPITAL_BASED_OUTPATIENT_CLINIC_OR_DEPARTMENT_OTHER): Payer: 59 | Admitting: Oncology

## 2010-08-09 ENCOUNTER — Other Ambulatory Visit: Payer: Self-pay | Admitting: Oncology

## 2010-08-09 DIAGNOSIS — D6859 Other primary thrombophilia: Secondary | ICD-10-CM

## 2010-08-09 DIAGNOSIS — Z7901 Long term (current) use of anticoagulants: Secondary | ICD-10-CM

## 2010-08-09 DIAGNOSIS — Z86718 Personal history of other venous thrombosis and embolism: Secondary | ICD-10-CM

## 2010-08-09 LAB — CBC WITH DIFFERENTIAL/PLATELET
Basophils Absolute: 0.1 10*3/uL (ref 0.0–0.1)
EOS%: 5.6 % (ref 0.0–7.0)
HCT: 39 % (ref 34.8–46.6)
HGB: 12.7 g/dL (ref 11.6–15.9)
MCH: 25.2 pg (ref 25.1–34.0)
MCV: 77.5 fL — ABNORMAL LOW (ref 79.5–101.0)
MONO%: 7 % (ref 0.0–14.0)
NEUT%: 61.1 % (ref 38.4–76.8)
Platelets: 326 10*3/uL (ref 145–400)

## 2010-08-17 ENCOUNTER — Other Ambulatory Visit: Payer: Self-pay | Admitting: Oncology

## 2010-08-17 ENCOUNTER — Encounter (HOSPITAL_BASED_OUTPATIENT_CLINIC_OR_DEPARTMENT_OTHER): Payer: 59 | Admitting: Oncology

## 2010-08-17 DIAGNOSIS — D6859 Other primary thrombophilia: Secondary | ICD-10-CM

## 2010-08-17 DIAGNOSIS — Z7901 Long term (current) use of anticoagulants: Secondary | ICD-10-CM

## 2010-08-17 DIAGNOSIS — Z86718 Personal history of other venous thrombosis and embolism: Secondary | ICD-10-CM

## 2010-08-17 LAB — PROTIME-INR: INR: 3.3 (ref 2.00–3.50)

## 2010-08-28 ENCOUNTER — Encounter (HOSPITAL_BASED_OUTPATIENT_CLINIC_OR_DEPARTMENT_OTHER): Payer: 59 | Admitting: Oncology

## 2010-08-28 ENCOUNTER — Other Ambulatory Visit: Payer: Self-pay | Admitting: Oncology

## 2010-08-28 DIAGNOSIS — Z7901 Long term (current) use of anticoagulants: Secondary | ICD-10-CM

## 2010-08-28 DIAGNOSIS — D6859 Other primary thrombophilia: Secondary | ICD-10-CM

## 2010-08-28 DIAGNOSIS — Z86718 Personal history of other venous thrombosis and embolism: Secondary | ICD-10-CM

## 2010-08-28 LAB — PROTIME-INR: INR: 3 (ref 2.00–3.50)

## 2010-09-12 ENCOUNTER — Encounter (HOSPITAL_BASED_OUTPATIENT_CLINIC_OR_DEPARTMENT_OTHER): Payer: BC Managed Care – PPO | Admitting: Oncology

## 2010-09-12 ENCOUNTER — Other Ambulatory Visit: Payer: Self-pay | Admitting: Oncology

## 2010-09-12 DIAGNOSIS — Z86718 Personal history of other venous thrombosis and embolism: Secondary | ICD-10-CM

## 2010-09-12 DIAGNOSIS — Z7901 Long term (current) use of anticoagulants: Secondary | ICD-10-CM

## 2010-09-12 DIAGNOSIS — D6859 Other primary thrombophilia: Secondary | ICD-10-CM

## 2010-09-12 LAB — PROTIME-INR

## 2010-09-15 NOTE — Op Note (Signed)
[place]. [facility]  Patient:    Lindsey Boone, Lindsey Boone Visit Number: [id number] MRN: [id number]          Service Type: DSU Location: [facility] [id number] Attending Physician:  Arlis Porta Dictated by:   Adolph Pollack, M.D. Proc. Date: 08/25/01 Admit Date:  08/25/2001   CC:         Dario Guardian, M.D.   Operative Report  PREOPERATIVE DIAGNOSIS:  Symptomatic cholelithiasis.  POSTOPERATIVE DIAGNOSIS:  Chronic calculus cholecystitis.  OPERATION PERFORMED: 1. Laparoscopic cholecystectomy with intraoperative cholangiogram. 2. Tru-Cut needle liver biopsy.  SURGEON:  Adolph Pollack, M.D.  ASSISTANT:  Lorne Skeens. Hoxworth, M.D.  ANESTHESIA:  General.  INDICATIONS FOR PROCEDURE:  Ms. Thumma is a 40 year old female who has been having fairly classic biliary colic type pains.  She was noted to have multiple gallstones and also elevation of liver function tests.  She continued to have these episodes.  I saw her in the urgent office and noted her to have a common bile duct that was normal in diameter on ultrasound and no pericholecystic fluid or common bile duct stones.  However, her liver function tests were noted to be elevated and today her bilirubin is 1.7 with an alkaline phosphatase of 134, SGOT of 375 and SGPT of 441.  She now presents for elective cholecystectomy with cholangiogram.  We did discuss the fact that she may have some choledocholithiasis and may need postoperative endoscopic retrograde cholangiopancreatography.  DESCRIPTION OF PROCEDURE:  She was placed supine on the operating table and general anesthetic was administered.  The abdomen was sterilely prepped and draped.  At the subumbilical region, local anesthetic was infiltrated and then a small incision was made in the subumbilical region and carried down through the fascial layer.  A small incision was made in the fascia and a pursestring suture of 0 Vicryl placed around the  fascial edges.  The peritoneal cavity was entered  bluntly and under direct vision.  A Hasson trocar was introduced into the peritoneal cavity and a pneumoperitoneum was created by insufflation of CO2 gas.  The laparoscope was introduced and she was placed in the appropriate position. Under direct vision, an 11 mm trocar was placed through an epigastric incision and two 5 mm trocars were placed in the right upper quadrant.  The fundus of the gallbladder was visualized and grasped.  There were minimal adhesions between the gallbladder and omentum.  I began by mobilizing the infundibulum completely and isolating a cystic duct and creating a window around it and a cystic artery.  There was an anterior branch of the cystic artery which I noted, clipped and divided.  I then placed a clip at the cystic duct, gallbladder junction and made an incision in the cystic duct.  A cholangiocatheter was passed through the anterior abdominal wall and put into the cystic duct and the cholangiogram was performed.  Under real time fluoroscopy, dilute contrast material was injected into the cystic duct.  The cystic duct promptly emptied the contrast into the common bile duct which promptly emptied it into the duodenum.  The common bile duct did not appear dilated or obstructed.  The common hepatic duct and the left and right hepatic ducts were visualized and did not appear to have any defects either.  Hard copies were made and sent to the radiologist.  Final interpretation per the radiologist is pending at this time.  Next, I removed the cholangiocatheter, the cystic duct was clipped  three  times proximally and divided.  The posterior branch of the cystic artery was clipped and divided.  The gallbladder was dissected free from the liver bed with the cautery.  A small hole was made in the gallbladder with a small amount of bile spillage.  The rest of the bile was evacuated by inserting a suction tip into the  gallbladder.  The gallbladder was then placed in an endopouch bag.  The gallbladder fossa was copiously irrigated with saline and no bleeding or bile leakage was noted.  Because of the fact the liver function tests were so elevated and I could not explain this based on her gallbladder pathology, I passed a Tru-Cut needle through the anterior abdominal wall and Tru-Cut liver biopsy was performed. In visualizing the liver, there appeared to be fatty changes as the color was abnormal somewhat.  The biopsy was adequate and the biopsy site bleeding was controlled with the cautery.  I further irrigated the perihepatic area and noted that the fluid was clear.  I inspected the gallbladder fossa one other time and noted no bleeding or bile leakage.  I then removed the gallbladder and the endopouch bag through the subumbilical port and closed the fascia defect under direct vision by tightening up and tying down the pursestring suture.  The remaining trocars were removed and a pneumoperitoneum was released.  The skin incisions were closed with four Monocryl subcuticular stitches followed by Steri-Strips and sterile dressings.  The patient tolerated the procedure well without any apparent complications and was taken to the recovery room in satisfactory condition. Dictated by:   Adolph Pollack, M.D. Attending Physician:  Arlis Porta DD:  08/25/01 TD:  08/25/01 Job: [id number]

## 2010-09-15 NOTE — Discharge Summary (Signed)
[place]. [facility]  Patient:    Lindsey Boone, Lindsey Boone Visit Number: [id number] MRN: [id number]          Service Type: MED Location: [phone] Attending Physician:  Lonia Blood Dictated by:   Gracelyn Nurse, M.D. Admit Date:  10/25/2001 Discharge Date: 11/04/2001   CC:         Dario Guardian, M.D.  D. Karle Plumber, M.D.   Discharge Summary  DISCHARGE DIAGNOSES: 1. Bilateral pulmonary emboli.    a. Hypercoagulability workup negative, including factor V Leiden, lupus       anticoagulant and homocysteine, protein C and protein S should be drawn       after treatment.    b. A CT scan of the lower extremities negative for deep venous thrombosis. 2. Anterior mediastinal mass.    a. Status post fine needle biopsy showing thymic hyperplasia. 3. History of Graves disease.    a. Status post radioactive iodine treatment two weeks ago. 4. History of thiouracil induced hepatitis. 5. Depression. 6. Status post cesarean section. 7. Status post cholecystectomy.  DISCHARGE MEDICATIONS:  Coumadin 5 mg q.d.  PROCEDURES PERFORMED: 1. A CT scan of the chest which showed both right upper and right lower lobe    pulmonary emboli, anterior mediastinal mass. 2. Lower extremity CT scan showed no evidence of DVT. 3. A CT guided fine needle biopsy of anterior mediastinal mass which showed    thymic hyperplasia.  HISTORY OF PRESENT ILLNESS:  The patient is a 40 year old white female. Today she experienced the onset of rapid heart rate. When it did not cease she reported to the emergency room, sensation of chest pressure. She had  a CT scan done which showed the pulmonary embolus.  HOSPITAL COURSE:  #1 - PULMONARY EMBOLUS:  This was found by CT scan. The patient was started on heparin IV. Coumadin was not started until the mediastinal mass could be biopsied. Afterwards it was started. She is now therapeutic on Coumadin, and is being overlapped by 48 hours  with heparin. She is to be discharged to home on 5 mg of Coumadin and will be followed up as an outpatient by her primary care physician for monitoring of her INR. She should have a full six to nine months of treatment. She did have hypercoagulability studies including lupus anticoagulant, homocysteine, factor V Leiden, all of which were negative. Protein C and S were not drawn because therapy had already been started. It was recommended that these be drawn once she had completed her anticoagulation therapy. She did have the mediastinal mass that made her pulmonary embolism kind of suspicious that it may have been caused by a malignancy. However, the biopsy showed only thymic hyperplasia.  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 Dr. Edwyna Shell saw her and he is going to follow up with this and recommend a follow up CT scan in the future.  DISCHARGE LABORATORY DATA:  INR 2.5.  DISPOSITION:  The patient is discharged in stable condition.  FOLLOWUP:  She is to follow up with Dr. Merri Brunette on November 06, 2001, and will have her INR monitored as an outpatient. Dictated by:   Gracelyn Nurse, M.D. Attending Physician:  Jetty Duhamel T DD:  11/03/01 TD:  11/05/01 Job: [id number]

## 2010-09-15 NOTE — H&P (Signed)
[place]. [facility]  Patient:    Lindsey Boone, Lindsey Boone Visit Number: [id number] MRN: [id number]          Service Type: MED Location: [phone] Attending Physician:  Jetty Duhamel T Dictated by:   Joycelyn Rua, M.D. Admit Date:  10/25/2001   CC:         Dario Guardian, M.D.   History and Physical  DATE OF BIRTH: 1971-03-03  PRIMARY PHYSICIAN: Dario Guardian, M.D., [facility].  ADMISSION DIAGNOSES:  1. Pulmonary embolus.  2. Narrow complex tachycardia.  3. History of Graves disease diagnosed four years ago and followed by     Dr. Adrian Prince.  Status post radioactive iodine treatment two weeks     ago.  4. Headaches.  5. 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  6. Depression, currently with suicidal ideation; treated for depression at     age 25.  21. Status post cesarean section.  8. Status post cholecystectomy.  9. Anterior mediastinal fullness seen on computerized tomography scan.  CHIEF COMPLAINT: Chest pain and palpitations.  HISTORY OF PRESENT ILLNESS: The patient is a 40 year old Caucasian female, whose past medical history is most significant for Graves disease, recently treated with radioactive iodine by Dr. Adrian Prince.  For the past few months she had been experiencing episodic transient palpitations that would not last for long.  She attributed the palpitations to her hyperthyroidism.  Today she experienced the onset of a "racing heart" starting at 3 p.m.  It did not cease so she went to the emergency department at [facility]. She also was experiencing a sensation of chest pressure was exhalation as well as light headedness.  Upon arrival to the emergency department she was found to have a narrow complex tachycardia.  It was believed that she might have SVT and she was given adenosine by Dr. Doug Sou.  Her tachycardia continued and she was then  treated with intravenous Inderal with success.  She was symptomatically better.  Dr. Ethelda Chick ordered a CT scan of the chest which, to our surprise, showed a pulmonary embolus.  She is not a smoker and is not on oral contraceptives.  She has not recently experienced any localized unilateral leg symptoms such as tenderness or warmth, although she has had some mild bilateral swelling of her legs.  No recent immobilization or trauma, although she did have a cholecystectomy in April 2003.  Of note, she was going to see Dr. Laurann Montana at [facility] this coming Monday to discuss depressive symptoms.  For the past few months she has experienced sadness and on occasions a desire to harm herself.  On one occasion she lightly cut her wrist but it was not deep enough to do any serious harm.  On another occasion she held a bottle of Ambien and nearly intended to take it as an overdose.  He husband has been aware of her depression and suicidal ideation.  Apparently her depression is aggravated by her medical condition, the stress of raising a five-year-old daughter, and her husbands recent leg fracture.  She is fearful of her own safety, although currently has no desire to harm herself.  PAST MEDICAL HISTORY: See admission diagnoses.  MEDICATIONS: None, although she is status post radioactive iodine treatment two weeks ago.  ALLERGIES: SULFA.  SOCIAL HISTORY: Married, with a five-year-old daughter.  No tobacco, alcohol, or illicit drug use.  FAMILY HISTORY: Hypothyroidism in  mother and grandmother.  Hypertension in her mother.  Lymphoma in her father.  Depression in her mother, father, and grandmother.  ADHD in her brother and father.  REVIEW OF SYSTEMS: Other than that noted above she has felt both warm and chilled at times, and has felt as though her legs have been unstable.  All other systems reviewed and negative.  PHYSICAL EXAMINATION:  VITAL SIGNS: At 2000  hours her blood pressure was 118/71, heart rate 96, respirations 17.  Pulse oximetry was unremarkable.  GENERAL: Not ill-appearing, although she was understandably tearful when she found that she has a pulmonary embolus.  HEENT: Tympanic membranes clear.  Eyes without erythema or discharge.  EOMI. PERRL.  No proptosis.  Oropharynx clear.  NECK: Supple without lymphadenopathy or tenderness.  She does have a mildly enlarged uniform goiter without tenderness or discrete nodule.  CHEST: Clear to auscultation bilaterally.  HEART: Regular without murmur.  By the time I examined her her heart rate was in the 90s.  ABDOMEN: Soft.  She did have some right upper quadrant tenderness, I believe to be attributed to her recent cholecystectomy.  No guarding or rebound.  PELVIC/BREAST: Examinations not performed.  EXTREMITIES:  Full range of motion without discomfort.  Normal strength throughout.  Her legs did seem to have a trace of nonpitting edema but that was symmetric.  No calf tenderness or palpable cords.  No rash.  LABORATORY DATA: Hemoglobin 16, hematocrit 48.  Sodium 141, potassium 3.2, chloride 107, bicarbonate 19, BUN 7, glucose 136.  CK 25, MB fraction 0.3, troponin I 0.01.  Chest x-ray was interpreted by the radiologist as NAD, although I did not have the films to view.  CT scan of the chest showed a pulmonary embolus effecting the right upper and lower lobes.  She also was noted to have a fullness of the anterior mediastinum, in the area of the thymus.  I spoke with the radiologist, who commented that such thymus enlargement would be very unusual in a woman her age and it could be concerning for lymphadenopathy.  ASSESSMENT:  1. Chest pain and tachycardia secondary to a pulmonary embolus of unknown     cause.  Other than her cholecystectomy in April 2003 there is no known     predisposing factor for thrombosis such as family history, tobacco use,     or use of oral  contraceptives.  It is possible that the anterior      mediastinal mass may represent a medical problem predisposing her to     thrombosis but that is unclear at this point.  At the time I saw her,     her tachycardia was well controlled with one single dose of Inderal.  2. Anterior mediastinal mass of unknown significance.  3. Graves disease.  4. Depression with suicidal ideation.  5. Propylthiouracil-induced hepatitis.  6. SULFA allergy.  PLAN:  1. Upon discovery of the pulmonary embolus she was immediately dosed     with heparin per pharmacy protocol.  She will need to be converted to     oral Coumadin, perhaps with the use of Lovenox as a bridge.  She was     admitted to either the intensive care unit or the step-down unit so she     could have close observation as well as cardiac monitoring and continuous     pulse oximetry.  She was made NPO initially to ensure that that was not     an issue if she were  to become stable overnight.  Initially I instituted     suicide precautions but that was limiting her bed placement and her     husband promised that he would stay with her at all times.  2. Propranolol orally prescribed for tachycardia, although it might be     discontinued in the morning if her heart rate remained in a normal range.  3. There is a need for further investigation of the mediastinal mass.  4. She will need psychiatric consultation.  It will need to be determined     whether that should be as an inpatient or an outpatient basis.  5. The [facility] hospitalist team will assume her care in the morning. Dictated by:   Joycelyn Rua, M.D. Attending Physician:  Jetty Duhamel T DD:  10/26/01 TD:  10/27/01 Job: [id number]

## 2010-09-15 NOTE — H&P (Signed)
NAMEBRIANNAH, Lindsey Boone                            ACCOUNT NO.:  [id number]   MEDICAL RECORD NO.:  [id number]                   PATIENT TYPE:   LOCATION:                                       FACILITY:  [facility]   PHYSICIAN:  Dineen Kid. Rana Snare, M.D.                 DATE OF BIRTH:  March 20, 1971   DATE OF ADMISSION:  04/26/2003  DATE OF DISCHARGE:                                HISTORY & PHYSICAL   HISTORY OF PRESENT ILLNESS:  Lindsey Boone is a 40 year old G3, P1, A1 at [redacted]  weeks gestational age, who presents for two-stage induction of labor.  She  has a history of cesarean section for breech presentation in 1998, which was  a low segment transverse cesarean section.  She desires vaginal birth after  cesarean section.  She also has a history of a pulmonary embolism in July  2003, and has been followed by Dr. Cyndie Chime and is currently on Lovenox  anticoagulation.  The pulmonary embolism was spontaneous in June 2003 and  she will discontinue her Lovenox at the time of induction of labor.  She  also has gestational diabetes, which is diet controlled, and hypothyroidism  which is also controlled with Synthroid.  Her estimated date of confinement  is May 09, 2003.  Her group B strep was negative on April 13, 2003.   MEDICATIONS:  Lovenox, Synthroid, prenatal vitamins.   ALLERGIES:  SULFA.   PAST SURGICAL HISTORY:  1. Low segment transverse cesarean section for a breech presentation.  2. Cholecystectomy.   PHYSICAL EXAMINATION:  VITAL SIGNS:  Blood pressure 112/84.  HEART:  Regular rate and rhythm.  LUNGS:  Clear to auscultation bilaterally.  ABDOMEN:  Gravid and nontender.  GENITOURINARY:  Cervix is closed, 50%, minus three station; with adequate  pelvis on telemetry.   DIAGNOSTIC LABS:  Last ultrasound on April 16, 2003 shows an estimated  fetal weight of 7 pounds 2 ounces, which  is 82%, with a normal AFI and  vertex presentation.   IMPRESSION AND PLAN:  1. Intrauterine pregnancy at [redacted]  weeks gestational age.  Previous cesarean     section for breech presentation, which is low segment transverse cesarean     section.  The patient desires vaginal birth after cesarean section and     wishes to proceed with trial of labor.  She understands that there is a     3% risk of uterine rupture, and she accepts this risk and is going to     proceed.  2. History of pulmonary embolism.  Currently anticoagulated on Lovenox.  We     will discontinue the Lovenox on the day of the two-stage induction.  Post-     partum we will plan Lovenox and Coumadin (per Dr. Cyndie Chime).  3. Gestational diabetes.  Diet controlled.  The fetus is slightly above     average weight at 82% and  a vertex presentation.  4. Plan cervical ripening followed by Pitocin, and a trial of labor.                                               Dineen Kid Rana Snare, M.D.    DCL/MEDQ  D:  04/26/2003  T:  04/26/2003  Job:  [id number]

## 2010-09-22 ENCOUNTER — Encounter (HOSPITAL_BASED_OUTPATIENT_CLINIC_OR_DEPARTMENT_OTHER): Payer: BC Managed Care – PPO | Admitting: Oncology

## 2010-09-22 ENCOUNTER — Other Ambulatory Visit: Payer: Self-pay | Admitting: Oncology

## 2010-09-22 DIAGNOSIS — Z86718 Personal history of other venous thrombosis and embolism: Secondary | ICD-10-CM

## 2010-09-22 DIAGNOSIS — Z7901 Long term (current) use of anticoagulants: Secondary | ICD-10-CM

## 2010-09-22 DIAGNOSIS — D6859 Other primary thrombophilia: Secondary | ICD-10-CM

## 2010-10-20 ENCOUNTER — Other Ambulatory Visit: Payer: Self-pay | Admitting: Oncology

## 2010-10-20 ENCOUNTER — Encounter (HOSPITAL_BASED_OUTPATIENT_CLINIC_OR_DEPARTMENT_OTHER): Payer: BC Managed Care – PPO | Admitting: Oncology

## 2010-10-20 DIAGNOSIS — Z86718 Personal history of other venous thrombosis and embolism: Secondary | ICD-10-CM

## 2010-10-20 DIAGNOSIS — D6859 Other primary thrombophilia: Secondary | ICD-10-CM

## 2010-10-20 DIAGNOSIS — Z7901 Long term (current) use of anticoagulants: Secondary | ICD-10-CM

## 2010-10-20 LAB — PROTIME-INR: Protime: 18 Seconds — ABNORMAL HIGH (ref 10.6–13.4)

## 2010-10-30 ENCOUNTER — Other Ambulatory Visit: Payer: Self-pay | Admitting: Oncology

## 2010-10-30 ENCOUNTER — Encounter (HOSPITAL_BASED_OUTPATIENT_CLINIC_OR_DEPARTMENT_OTHER): Payer: BC Managed Care – PPO | Admitting: Oncology

## 2010-10-30 DIAGNOSIS — D6859 Other primary thrombophilia: Secondary | ICD-10-CM

## 2010-10-30 DIAGNOSIS — Z7901 Long term (current) use of anticoagulants: Secondary | ICD-10-CM

## 2010-10-30 DIAGNOSIS — Z86718 Personal history of other venous thrombosis and embolism: Secondary | ICD-10-CM

## 2010-10-30 LAB — PROTIME-INR
INR: 1.9 — ABNORMAL LOW (ref 2.00–3.50)
Protime: 22.8 Seconds — ABNORMAL HIGH (ref 10.6–13.4)

## 2010-11-07 ENCOUNTER — Encounter (HOSPITAL_BASED_OUTPATIENT_CLINIC_OR_DEPARTMENT_OTHER): Payer: BC Managed Care – PPO | Admitting: Oncology

## 2010-11-07 ENCOUNTER — Other Ambulatory Visit: Payer: Self-pay | Admitting: Oncology

## 2010-11-07 DIAGNOSIS — Z7901 Long term (current) use of anticoagulants: Secondary | ICD-10-CM

## 2010-11-07 DIAGNOSIS — Z86718 Personal history of other venous thrombosis and embolism: Secondary | ICD-10-CM

## 2010-11-07 DIAGNOSIS — D6859 Other primary thrombophilia: Secondary | ICD-10-CM

## 2010-11-07 LAB — PROTIME-INR: INR: 2 (ref 2.00–3.50)

## 2010-11-22 ENCOUNTER — Encounter (HOSPITAL_BASED_OUTPATIENT_CLINIC_OR_DEPARTMENT_OTHER): Payer: BC Managed Care – PPO | Admitting: Oncology

## 2010-11-22 ENCOUNTER — Other Ambulatory Visit: Payer: Self-pay | Admitting: Oncology

## 2010-11-22 DIAGNOSIS — Z7901 Long term (current) use of anticoagulants: Secondary | ICD-10-CM

## 2010-11-22 DIAGNOSIS — D6859 Other primary thrombophilia: Secondary | ICD-10-CM

## 2010-11-22 DIAGNOSIS — Z86718 Personal history of other venous thrombosis and embolism: Secondary | ICD-10-CM

## 2010-11-22 LAB — IRON AND TIBC
%SAT: 10 % — ABNORMAL LOW (ref 20–55)
TIBC: 336 ug/dL (ref 250–470)

## 2010-11-22 LAB — CBC WITH DIFFERENTIAL/PLATELET
Basophils Absolute: 0 10*3/uL (ref 0.0–0.1)
Eosinophils Absolute: 0.5 10*3/uL (ref 0.0–0.5)
HCT: 37.6 % (ref 34.8–46.6)
HGB: 12.2 g/dL (ref 11.6–15.9)
MCH: 25.2 pg (ref 25.1–34.0)
MONO#: 0.4 10*3/uL (ref 0.1–0.9)
NEUT#: 4.8 10*3/uL (ref 1.5–6.5)
NEUT%: 64.4 % (ref 38.4–76.8)
WBC: 7.4 10*3/uL (ref 3.9–10.3)
lymph#: 1.7 10*3/uL (ref 0.9–3.3)

## 2010-11-22 LAB — PROTIME-INR

## 2010-11-22 LAB — FERRITIN: Ferritin: 10 ng/mL (ref 10–291)

## 2010-12-05 ENCOUNTER — Emergency Department (HOSPITAL_COMMUNITY)
Admission: EM | Admit: 2010-12-05 | Discharge: 2010-12-06 | Disposition: A | Discharge status: Home / Self Care | Payer: BC Managed Care – PPO | Attending: Emergency Medicine | Admitting: Emergency Medicine

## 2010-12-05 DIAGNOSIS — R0789 Other chest pain: Secondary | ICD-10-CM | POA: Insufficient documentation

## 2010-12-05 DIAGNOSIS — E039 Hypothyroidism, unspecified: Secondary | ICD-10-CM | POA: Insufficient documentation

## 2010-12-05 DIAGNOSIS — Z7901 Long term (current) use of anticoagulants: Secondary | ICD-10-CM | POA: Insufficient documentation

## 2010-12-05 DIAGNOSIS — R0609 Other forms of dyspnea: Secondary | ICD-10-CM | POA: Insufficient documentation

## 2010-12-05 DIAGNOSIS — Z86718 Personal history of other venous thrombosis and embolism: Secondary | ICD-10-CM | POA: Insufficient documentation

## 2010-12-05 DIAGNOSIS — R0989 Other specified symptoms and signs involving the circulatory and respiratory systems: Secondary | ICD-10-CM | POA: Insufficient documentation

## 2010-12-06 ENCOUNTER — Encounter (HOSPITAL_COMMUNITY): Payer: Self-pay

## 2010-12-06 ENCOUNTER — Emergency Department (HOSPITAL_COMMUNITY): Payer: BC Managed Care – PPO

## 2010-12-06 LAB — CBC
Hemoglobin: 12.2 g/dL (ref 12.0–15.0)
Platelets: 264 10*3/uL (ref 150–400)
RBC: 4.77 MIL/uL (ref 3.87–5.11)

## 2010-12-06 LAB — DIFFERENTIAL
Basophils Absolute: 0.1 10*3/uL (ref 0.0–0.1)
Basophils Relative: 1 % (ref 0–1)
Neutro Abs: 5 10*3/uL (ref 1.7–7.7)
Neutrophils Relative %: 57 % (ref 43–77)

## 2010-12-06 LAB — COMPREHENSIVE METABOLIC PANEL
ALT: 20 U/L (ref 0–35)
AST: 24 U/L (ref 0–37)
Calcium: 9.4 mg/dL (ref 8.4–10.5)
Creatinine, Ser: 0.63 mg/dL (ref 0.50–1.10)
GFR calc Af Amer: >60 mL/min (ref >60)
Glucose, Bld: 102 mg/dL — ABNORMAL HIGH (ref 70–99)
Sodium: 138 mEq/L (ref 135–145)
Total Protein: 7.9 g/dL (ref 6.0–8.3)

## 2010-12-06 LAB — POCT I-STAT TROPONIN I: Troponin i, poc: 0 ng/mL (ref 0.00–0.08)

## 2010-12-06 LAB — PROTIME-INR: INR: 1.91 — ABNORMAL HIGH (ref 0.00–1.49)

## 2010-12-06 MED ORDER — IOHEXOL 300 MG/ML  SOLN
100.0000 mL | Freq: Once | INTRAMUSCULAR | Status: AC | PRN
  Administered 2010-12-06: 100 mL via INTRAVENOUS

## 2010-12-13 ENCOUNTER — Encounter (HOSPITAL_BASED_OUTPATIENT_CLINIC_OR_DEPARTMENT_OTHER): Payer: BC Managed Care – PPO | Admitting: Oncology

## 2010-12-13 ENCOUNTER — Other Ambulatory Visit: Payer: Self-pay | Admitting: Oncology

## 2010-12-13 DIAGNOSIS — Z7901 Long term (current) use of anticoagulants: Secondary | ICD-10-CM

## 2010-12-13 DIAGNOSIS — D6859 Other primary thrombophilia: Secondary | ICD-10-CM

## 2010-12-13 DIAGNOSIS — Z86718 Personal history of other venous thrombosis and embolism: Secondary | ICD-10-CM

## 2010-12-13 DIAGNOSIS — D500 Iron deficiency anemia secondary to blood loss (chronic): Secondary | ICD-10-CM

## 2010-12-13 LAB — PROTIME-INR
INR: 2.2 (ref 2.00–3.50)
Protime: 26.4 Seconds — ABNORMAL HIGH (ref 10.6–13.4)

## 2011-01-09 ENCOUNTER — Other Ambulatory Visit: Payer: Self-pay | Admitting: Oncology

## 2011-01-09 ENCOUNTER — Encounter (HOSPITAL_BASED_OUTPATIENT_CLINIC_OR_DEPARTMENT_OTHER): Payer: BC Managed Care – PPO | Admitting: Oncology

## 2011-01-09 DIAGNOSIS — Z7901 Long term (current) use of anticoagulants: Secondary | ICD-10-CM

## 2011-01-09 DIAGNOSIS — D500 Iron deficiency anemia secondary to blood loss (chronic): Secondary | ICD-10-CM

## 2011-01-09 DIAGNOSIS — D6859 Other primary thrombophilia: Secondary | ICD-10-CM

## 2011-01-09 DIAGNOSIS — Z86718 Personal history of other venous thrombosis and embolism: Secondary | ICD-10-CM

## 2011-01-09 LAB — PROTIME-INR: Protime: 24 Seconds — ABNORMAL HIGH (ref 10.6–13.4)

## 2011-02-08 ENCOUNTER — Other Ambulatory Visit: Payer: Self-pay | Admitting: Oncology

## 2011-02-08 ENCOUNTER — Encounter (HOSPITAL_BASED_OUTPATIENT_CLINIC_OR_DEPARTMENT_OTHER): Payer: BC Managed Care – PPO | Admitting: Hematology & Oncology

## 2011-02-08 DIAGNOSIS — D500 Iron deficiency anemia secondary to blood loss (chronic): Secondary | ICD-10-CM

## 2011-02-08 DIAGNOSIS — D6859 Other primary thrombophilia: Secondary | ICD-10-CM

## 2011-02-08 DIAGNOSIS — Z7901 Long term (current) use of anticoagulants: Secondary | ICD-10-CM

## 2011-02-08 DIAGNOSIS — Z86718 Personal history of other venous thrombosis and embolism: Secondary | ICD-10-CM

## 2011-02-08 LAB — PROTIME-INR (CHCC SATELLITE): Protime: 28.8 Seconds — ABNORMAL HIGH (ref 10.6–13.4)

## 2011-03-08 ENCOUNTER — Ambulatory Visit: Payer: Self-pay | Admitting: Pharmacist

## 2011-03-08 ENCOUNTER — Ambulatory Visit (HOSPITAL_BASED_OUTPATIENT_CLINIC_OR_DEPARTMENT_OTHER): Payer: BC Managed Care – PPO | Admitting: Lab

## 2011-03-08 DIAGNOSIS — Z7901 Long term (current) use of anticoagulants: Secondary | ICD-10-CM

## 2011-03-08 DIAGNOSIS — D6859 Other primary thrombophilia: Secondary | ICD-10-CM

## 2011-03-08 DIAGNOSIS — D500 Iron deficiency anemia secondary to blood loss (chronic): Secondary | ICD-10-CM

## 2011-03-08 DIAGNOSIS — O88219 Thromboembolism in pregnancy, unspecified trimester: Secondary | ICD-10-CM

## 2011-03-08 DIAGNOSIS — Z86718 Personal history of other venous thrombosis and embolism: Secondary | ICD-10-CM

## 2011-03-21 ENCOUNTER — Other Ambulatory Visit: Payer: Self-pay | Admitting: Oncology

## 2011-03-21 DIAGNOSIS — O88219 Thromboembolism in pregnancy, unspecified trimester: Secondary | ICD-10-CM

## 2011-04-05 ENCOUNTER — Telehealth: Payer: Self-pay | Admitting: Pharmacist

## 2011-04-05 ENCOUNTER — Other Ambulatory Visit: Payer: Self-pay | Admitting: Oncology

## 2011-04-05 ENCOUNTER — Ambulatory Visit: Payer: Self-pay | Admitting: Internal Medicine

## 2011-04-05 ENCOUNTER — Other Ambulatory Visit (HOSPITAL_BASED_OUTPATIENT_CLINIC_OR_DEPARTMENT_OTHER): Payer: BC Managed Care – PPO | Admitting: Lab

## 2011-04-05 ENCOUNTER — Telehealth: Payer: Self-pay | Admitting: *Deleted

## 2011-04-05 DIAGNOSIS — O88219 Thromboembolism in pregnancy, unspecified trimester: Secondary | ICD-10-CM

## 2011-04-05 LAB — PROTIME-INR (CHCC SATELLITE)

## 2011-04-05 LAB — POCT INR: INR: 1.8

## 2011-04-05 NOTE — Telephone Encounter (Signed)
Left msg on voicemail informing patient that her lab appt for 04/12/11 has been scheduled for 11:15 am at [facility].

## 2011-04-05 NOTE — Progress Notes (Signed)
INR subtherapeutic.   No complaints, no changes in meds or diet.   Will continue current dose for now.  Pt has been stable on 7.5mg  daily except 10mg  on MWF since August.  Recheck INR in 1 week at [facility] to ensure return to therapeutic range.

## 2011-04-05 NOTE — Telephone Encounter (Signed)
Per note [name] will let pt know time for 12-13 appointment

## 2011-04-12 ENCOUNTER — Other Ambulatory Visit: Payer: BC Managed Care – PPO | Admitting: Lab

## 2011-04-12 ENCOUNTER — Telehealth: Payer: Self-pay | Admitting: Hematology & Oncology

## 2011-04-12 NOTE — Telephone Encounter (Signed)
Pt called and cx 12/13 apt and resch for 12/17

## 2011-04-16 ENCOUNTER — Other Ambulatory Visit: Payer: Self-pay | Admitting: Oncology

## 2011-04-16 ENCOUNTER — Other Ambulatory Visit: Payer: Self-pay | Admitting: Pharmacist

## 2011-04-16 ENCOUNTER — Other Ambulatory Visit (HOSPITAL_BASED_OUTPATIENT_CLINIC_OR_DEPARTMENT_OTHER): Payer: BC Managed Care – PPO | Admitting: Lab

## 2011-04-16 ENCOUNTER — Ambulatory Visit: Payer: Self-pay | Admitting: Oncology

## 2011-04-16 DIAGNOSIS — I2699 Other pulmonary embolism without acute cor pulmonale: Secondary | ICD-10-CM

## 2011-04-16 DIAGNOSIS — O88219 Thromboembolism in pregnancy, unspecified trimester: Secondary | ICD-10-CM

## 2011-04-16 LAB — PROTIME-INR (CHCC SATELLITE)
INR: 2 (ref 2.0–3.5)
Protime: 24 s — ABNORMAL HIGH (ref 10.6–13.4)

## 2011-04-16 LAB — POCT INR: INR: 2

## 2011-04-16 NOTE — Progress Notes (Signed)
Prescripton Refill: Warfarin 5mg  tablets. Instructions: Take 7.5mg  po daily except 10mg  on MWF. MD: Cephas Darby Refills: 3 Return Phone: [phone] Called to [facility] on [address], [phone]

## 2011-04-27 ENCOUNTER — Telehealth: Payer: Self-pay | Admitting: *Deleted

## 2011-05-03 ENCOUNTER — Other Ambulatory Visit (HOSPITAL_BASED_OUTPATIENT_CLINIC_OR_DEPARTMENT_OTHER): Payer: 59 | Admitting: Lab

## 2011-05-03 ENCOUNTER — Other Ambulatory Visit: Payer: Self-pay | Admitting: Hematology & Oncology

## 2011-05-03 DIAGNOSIS — D6859 Other primary thrombophilia: Secondary | ICD-10-CM

## 2011-05-03 DIAGNOSIS — Z7901 Long term (current) use of anticoagulants: Secondary | ICD-10-CM

## 2011-05-03 DIAGNOSIS — Z86718 Personal history of other venous thrombosis and embolism: Secondary | ICD-10-CM

## 2011-05-03 LAB — PROTIME-INR (CHCC SATELLITE)

## 2011-05-03 LAB — POCT INR: INR: 1.7

## 2011-05-04 ENCOUNTER — Ambulatory Visit (HOSPITAL_BASED_OUTPATIENT_CLINIC_OR_DEPARTMENT_OTHER): Payer: Self-pay | Admitting: Oncology

## 2011-05-04 DIAGNOSIS — O88219 Thromboembolism in pregnancy, unspecified trimester: Secondary | ICD-10-CM

## 2011-05-18 ENCOUNTER — Ambulatory Visit (HOSPITAL_BASED_OUTPATIENT_CLINIC_OR_DEPARTMENT_OTHER): Payer: Self-pay | Admitting: Oncology

## 2011-05-18 ENCOUNTER — Other Ambulatory Visit (HOSPITAL_BASED_OUTPATIENT_CLINIC_OR_DEPARTMENT_OTHER): Payer: 59 | Admitting: Lab

## 2011-05-18 DIAGNOSIS — O88219 Thromboembolism in pregnancy, unspecified trimester: Secondary | ICD-10-CM

## 2011-05-18 DIAGNOSIS — I749 Embolism and thrombosis of unspecified artery: Secondary | ICD-10-CM

## 2011-05-18 LAB — PROTIME-INR (CHCC SATELLITE): Protime: 24 Seconds — ABNORMAL HIGH (ref 10.6–13.4)

## 2011-05-18 LAB — POCT INR: INR: 2

## 2011-05-18 NOTE — Progress Notes (Signed)
No complaints re: anticoag. Cont same dose. Return to [facility] for lab in 6 weeks (on 06/29/11). Marily Lente, Pharm.D.

## 2011-05-31 ENCOUNTER — Other Ambulatory Visit: Payer: Self-pay

## 2011-05-31 DIAGNOSIS — O88219 Thromboembolism in pregnancy, unspecified trimester: Secondary | ICD-10-CM

## 2011-05-31 MED ORDER — WARFARIN SODIUM 5 MG PO TABS: ORAL_TABLET | ORAL | Status: DC

## 2011-06-12 ENCOUNTER — Other Ambulatory Visit: Payer: Self-pay | Admitting: Pharmacist

## 2011-06-12 DIAGNOSIS — O88219 Thromboembolism in pregnancy, unspecified trimester: Secondary | ICD-10-CM

## 2011-06-29 ENCOUNTER — Ambulatory Visit (HOSPITAL_BASED_OUTPATIENT_CLINIC_OR_DEPARTMENT_OTHER): Payer: 59 | Admitting: Pharmacist

## 2011-06-29 ENCOUNTER — Other Ambulatory Visit (HOSPITAL_BASED_OUTPATIENT_CLINIC_OR_DEPARTMENT_OTHER): Payer: 59 | Admitting: Lab

## 2011-06-29 DIAGNOSIS — Z7901 Long term (current) use of anticoagulants: Secondary | ICD-10-CM

## 2011-06-29 DIAGNOSIS — Z86718 Personal history of other venous thrombosis and embolism: Secondary | ICD-10-CM

## 2011-06-29 DIAGNOSIS — O88219 Thromboembolism in pregnancy, unspecified trimester: Secondary | ICD-10-CM

## 2011-06-29 NOTE — Progress Notes (Signed)
Pt doing well re: anticoagulation. INR therapeutic. Cont. Same dose. Return in 6 weeks; lab in [facility].  Scheduled. Marily Lente, Pharm.D.

## 2011-08-10 ENCOUNTER — Other Ambulatory Visit: Payer: 59 | Admitting: Lab

## 2011-08-10 ENCOUNTER — Ambulatory Visit (INDEPENDENT_AMBULATORY_CARE_PROVIDER_SITE_OTHER): Payer: 59 | Admitting: Surgery

## 2011-08-10 ENCOUNTER — Encounter (INDEPENDENT_AMBULATORY_CARE_PROVIDER_SITE_OTHER): Payer: Self-pay | Admitting: Surgery

## 2011-08-10 VITALS — BP 134/96 | HR 70 | Temp 97.8°F | Resp 18 | Ht 64.0 in | Wt 224.5 lb

## 2011-08-10 DIAGNOSIS — K439 Ventral hernia without obstruction or gangrene: Secondary | ICD-10-CM

## 2011-08-10 NOTE — Progress Notes (Signed)
Patient ID: Lindsey Boone, female   DOB: 09/11/1970, 41 y.o.   MRN: [id number]  Chief Complaint  Patient presents with  . Hernia    Adbominal wall    HPI Lindsey Boone is a 41 y.o. female.  PCP is Dr. Tammy Spear HPI The patient was seen last year for a ventral hernia. Unfortunately prior to scheduling the surgery she lost her job as well as her medical insurance coverage. She now has a new job with benefits and comes in today to discuss hernia repair. The ventral hernia has enlarged and become more uncomfortable. It remains mostly reducible.  This patient is status post laparoscopic cholecystectomy by Dr. Rosenbower in April 2003. She had a postoperative pulmonary embolus and is anticoagulated. This is managed by Dr. James Granfortuna. She also developed drug-induced hepatitis managed by Dr. James Edwards. Otherwise her health is unchanged from last year. Past Medical History  Diagnosis Date  . Migraine   . Hypothyroid   . Iron deficiency anemia, unspecified   . Hyperlipemia   . Edema   . Clotting disorder   . Abdominal wall hernia   . Leg swelling   . Abdominal distension   . Nausea     Past Surgical History  Procedure Date  . Umbilical hernia repair     w/o obst or gangrene  . Cesarean section 08/20/96  . Cholecystectomy 09/2001    History reviewed. No pertinent family history. Father - non-Hodgkins lymphoma  Social History History  Substance Use Topics  . Smoking status: Never Smoker   . Smokeless tobacco: Never Used  . Alcohol Use: No    Allergies  Allergen Reactions  . Sulfa Antibiotics Hives    Upper torso only.    Current Outpatient Prescriptions  Medication Sig Dispense Refill  . metroNIDAZOLE (METROCREAM) 0.75 % cream Daily.      . fish oil-omega-3 fatty acids 1000 MG capsule Take 1 g by mouth daily. Take 4 tablets daily; 1 gm each       . levothyroxine (SYNTHROID, LEVOTHROID) 125 MCG tablet Take 125 mcg by mouth daily.        . Multiple Vitamin  (MULTIVITAMIN) capsule Take 1 capsule by mouth daily.        . warfarin (COUMADIN) 5 MG tablet Take daily as directed  90 tablet  4    Review of Systems Review of Systems  Constitutional: Negative for fever, chills and unexpected weight change.  HENT: Negative for hearing loss, congestion, sore throat, trouble swallowing and voice change.   Eyes: Negative for visual disturbance.  Respiratory: Negative for cough and wheezing.   Cardiovascular: Positive for leg swelling. Negative for chest pain and palpitations.  Gastrointestinal: Positive for nausea, abdominal pain and abdominal distention. Negative for vomiting, diarrhea, constipation, blood in stool and anal bleeding.  Genitourinary: Negative for hematuria, vaginal bleeding and difficulty urinating.  Musculoskeletal: Negative for arthralgias.  Skin: Negative for rash and wound.  Neurological: Negative for seizures, syncope and headaches.  Hematological: Negative for adenopathy. Does not bruise/bleed easily.  Psychiatric/Behavioral: Negative for confusion.    Blood pressure 134/96, pulse 70, temperature 97.8 F (36.6 C), temperature source Temporal, resp. rate 18, height 5' 4" (1.626 m), weight 224 lb 8 oz (101.833 kg).  Physical Exam Physical Exam WDWN in NAD HEENT:  EOMI, sclera anicteric Neck:  No masses, no thyromegaly Lungs:  CTA bilaterally; normal respiratory effort CV:  Regular rate and rhythm; no murmurs Abd:  +bowel sounds, soft, non-tender, healed laparoscopic incisions.    Visible bulge at the upper edge of her umbilicus.  Above the umbilicus, there is a 4 cm bulge which is partially reducible.  No skin changes Ext:  Well-perfused; no edema Skin:  Warm, dry; no sign of jaundice  Data Reviewed none  Assessment    Enlarging ventral hernia - supraumbilical    Plan    Open ventral hernia repair with mesh.  The surgical procedure has been discussed with the patient.  Potential risks, benefits, alternative treatments,  and expected outcomes have been explained.  All of the patient's questions at this time have been answered.  The likelihood of reaching the patient's treatment goal is good.  The patient understand the proposed surgical procedure and wishes to proceed. We have a clearance letter from Dr. Granfortuna stating that she is OK to stop her Coumadin for 3 days prior to surgery.  No need for Lovenox bridge.       Osher Oettinger K. 08/10/2011, 11:53 AM    

## 2011-08-10 NOTE — Patient Instructions (Signed)
Call our schedulers at [phone]

## 2011-08-13 ENCOUNTER — Ambulatory Visit (HOSPITAL_BASED_OUTPATIENT_CLINIC_OR_DEPARTMENT_OTHER): Payer: Self-pay | Admitting: Pharmacist

## 2011-08-13 ENCOUNTER — Other Ambulatory Visit (HOSPITAL_BASED_OUTPATIENT_CLINIC_OR_DEPARTMENT_OTHER): Payer: 59 | Admitting: Lab

## 2011-08-13 DIAGNOSIS — O88219 Thromboembolism in pregnancy, unspecified trimester: Secondary | ICD-10-CM

## 2011-08-13 DIAGNOSIS — Z7901 Long term (current) use of anticoagulants: Secondary | ICD-10-CM

## 2011-08-13 LAB — PROTIME-INR (CHCC SATELLITE): INR: 2.1 (ref 2.0–3.5)

## 2011-08-13 NOTE — Progress Notes (Signed)
INR therapeutic today on 7.5 mg/day; 10 mg MWF. Pt having open ventral hernia repair w/ mesh w/ Dr. Stevan Born soon.  The exact date of surgery has not been determined.  Pt will call us when it is set.  Pt states this will probably be outpatient but if she prefers, Dr. Harlon Flor will keep her overnight in the hospital. For now pt will stay on her same maintenance dose of Coumadin.  Plan per Dr. Cyndie Chime for bridging anticoagulation for surgery is as follows (I have not gone over these details w/ pt yet nor have I scheduled any appts for her INR checks.  Will need to do when pt calls [place] w/ surgery date.): Stop Coumadin 3 days prior to surgery. Check INR at [facility] day before surgery.  No Lovenox pre-op.  Fax results to Dr. Bettey Mare office (fax # [phone]; phone # [phone]) Restart Coumadin the evening of surgery. Give Lovenox 1 mg/kg SQ x 1 dose 24 hrs post-op. Start Lovenox 1.5 mg/kg/day SQ 48 hrs post-op & continue until INR = 2-3. Recheck INR at [facility] 4-5 days post-op.  Marily Lente, Pharm.D.

## 2011-08-15 ENCOUNTER — Encounter (HOSPITAL_COMMUNITY): Payer: Self-pay | Admitting: Pharmacy Technician

## 2011-08-16 ENCOUNTER — Telehealth: Payer: Self-pay | Admitting: Pharmacist

## 2011-08-16 DIAGNOSIS — O88219 Thromboembolism in pregnancy, unspecified trimester: Secondary | ICD-10-CM

## 2011-08-16 MED ORDER — ENOXAPARIN SODIUM 150 MG/ML ~~LOC~~ SOLN: 150.0000 mg | Freq: Every day | SUBCUTANEOUS | Status: DC

## 2011-08-16 NOTE — Telephone Encounter (Signed)
Pt called to let us know the date of her hernia repair surgery with Dr. [name]. Surgery date: 4/30  Anticoagulation Plan for surgery: She will take her last dose of Coumadin on Friday, 4/26. Hold Coumadin 4/27-4/29. Check INR the day before surgery on 4/29 at 11:15am at [facility] location. Fax results to Dr. Bettey Mare office (fax # [phone]; phone # [phone]) 4/30: Resume Coumadin the evening of procedure 5/1: 24 hours post-op, Give lovenox 1mg /kg SQ x 1 = 100mg  5/2: 48 hours post-op, increase lovenox to 1.5mg /kg SQ daily = 150mg . Then, continue Lovenox 150mg  daily and coumadin daily until INR >/= to 2. Recheck INR at [facility] on 09/03/11 @ 3:30pm.  Pt will get the Lovenox 100mg  syringe for her 100mg  dose on 5/1 from [place] on 08/27/11. Lovenox prescription called into pharmacy on @ [address] +++++Please remember to provide pt with 100mg  syringe (#1) on 08/27/11.+++++++++

## 2011-08-22 ENCOUNTER — Encounter (HOSPITAL_COMMUNITY)
Admission: RE | Admit: 2011-08-22 | Discharge: 2011-08-22 | Disposition: A | Discharge status: Home / Self Care | Payer: 59 | Source: Ambulatory Visit | Attending: Surgery | Admitting: Surgery

## 2011-08-22 ENCOUNTER — Encounter (HOSPITAL_COMMUNITY): Payer: Self-pay

## 2011-08-22 ENCOUNTER — Ambulatory Visit (HOSPITAL_COMMUNITY)
Admission: RE | Admit: 2011-08-22 | Discharge: 2011-08-22 | Disposition: A | Discharge status: Home / Self Care | Payer: 59 | Source: Ambulatory Visit | Attending: Anesthesiology | Admitting: Anesthesiology

## 2011-08-22 DIAGNOSIS — Z01810 Encounter for preprocedural cardiovascular examination: Secondary | ICD-10-CM | POA: Insufficient documentation

## 2011-08-22 DIAGNOSIS — Z01812 Encounter for preprocedural laboratory examination: Secondary | ICD-10-CM | POA: Insufficient documentation

## 2011-08-22 DIAGNOSIS — Z01818 Encounter for other preprocedural examination: Secondary | ICD-10-CM | POA: Insufficient documentation

## 2011-08-22 HISTORY — DX: Shortness of breath: R06.02

## 2011-08-22 HISTORY — DX: Personal history of other diseases of the digestive system: Z87.19

## 2011-08-22 LAB — PROTIME-INR
INR: 2.43 — ABNORMAL HIGH (ref 0.00–1.49)
Prothrombin Time: 26.8 seconds — ABNORMAL HIGH (ref 11.6–15.2)

## 2011-08-22 LAB — BASIC METABOLIC PANEL
GFR calc non Af Amer: >90 mL/min (ref >90)
Glucose, Bld: 93 mg/dL (ref 70–99)
Potassium: 3.8 mEq/L (ref 3.5–5.1)
Sodium: 137 mEq/L (ref 135–145)

## 2011-08-22 LAB — CBC
Hemoglobin: 13 g/dL (ref 12.0–15.0)
MCH: 24.3 pg — ABNORMAL LOW (ref 26.0–34.0)
RBC: 5.35 MIL/uL — ABNORMAL HIGH (ref 3.87–5.11)
WBC: 8.8 10*3/uL (ref 4.0–10.5)

## 2011-08-22 LAB — SURGICAL PCR SCREEN: MRSA, PCR: NEGATIVE

## 2011-08-22 LAB — HCG, SERUM, QUALITATIVE: Preg, Serum: NEGATIVE

## 2011-08-22 LAB — APTT: aPTT: 55 seconds — ABNORMAL HIGH (ref 24–37)

## 2011-08-22 NOTE — Pre-Procedure Instructions (Signed)
20 KEARY HANAK  08/22/2011   Your procedure is scheduled on:  08/28/2011  Report to [facility] at 0530 AM.  Call this number if you have problems the morning of surgery: [phone]   Remember:   Do not eat food:After Midnight.  May have clear liquids: up to 4 Hours before arrival.0130 ... Do not drink after 0130 am the day of surgery .  Clear liquids include soda, tea, black coffee, apple or grape juice, broth.  Take these medicines the morning of surgery with A SIP OF WATER: synthroid     Do not wear jewelry, make-up or nail polish.  Do not wear lotions, powders, or perfumes. You may wear deodorant.  Do not shave 48 hours prior to surgery.  Do not bring valuables to the hospital.  Contacts, dentures or bridgework may not be worn into surgery.  Leave suitcase in the car. After surgery it may be brought to your room.  For patients admitted to the hospital, checkout time is 11:00 AM the day of discharge.   Patients discharged the day of surgery will not be allowed to drive home.  Name and phone number of your driver: Mardelle Matte -husband [phone]  Special Instructions: CHG Shower Use Special Wash: 1/2 bottle night before surgery and 1/2 bottle morning of surgery.   Please read over the following fact sheets that you were given: Pain Booklet, Coughing and Deep Breathing, MRSA Information and Surgical Site Infection Prevention

## 2011-08-23 NOTE — Consult Note (Signed)
Anesthesia Chart Review:  Patient is a 41 year old female scheduled for [facility] on 08/28/11.  History includes hypothyroidism, non-smoker, HLD, migraines, HH. DOE, obesity with BMI 38, post-operative PE following lap chole in '03.  Her Hematologist is Dr. Cyndie Chime who is aware of this procedure and has given Dr. [name] anticoagulation instructions.  She also has a history of drug induced hepatitis since last year according to Dr. Fatima Sanger last office note.  Labs reviewed.  She will need a PT/PTT repeated on arrival for surgery.  I'll also order HFP due to her history of drug induced hepatitis.  Negative CXR on 08/22/11.  EKG from 12/18/10 showed SR, probable LA abnormality.   If follow-up labs reasonable, then anticipate she can proceed.  Shonna Chock,, PA-C

## 2011-08-27 ENCOUNTER — Other Ambulatory Visit (HOSPITAL_BASED_OUTPATIENT_CLINIC_OR_DEPARTMENT_OTHER): Payer: 59 | Admitting: Lab

## 2011-08-27 ENCOUNTER — Ambulatory Visit (HOSPITAL_BASED_OUTPATIENT_CLINIC_OR_DEPARTMENT_OTHER): Payer: Self-pay | Admitting: Pharmacist

## 2011-08-27 DIAGNOSIS — O88219 Thromboembolism in pregnancy, unspecified trimester: Secondary | ICD-10-CM

## 2011-08-27 LAB — PROTIME-INR (CHCC SATELLITE): INR: 1.8 — ABNORMAL LOW (ref 2.0–3.5)

## 2011-08-27 MED ORDER — CEFAZOLIN SODIUM-DEXTROSE 2-3 GM-% IV SOLR
2.0000 g | INTRAVENOUS | Status: DC
  Filled 2011-08-27: qty 50

## 2011-08-28 ENCOUNTER — Ambulatory Visit (HOSPITAL_COMMUNITY)
Admission: RE | Admit: 2011-08-28 | Discharge: 2011-08-28 | Disposition: A | Discharge status: Home / Self Care | Payer: 59 | Source: Ambulatory Visit | Attending: Surgery | Admitting: Surgery

## 2011-08-28 ENCOUNTER — Encounter (HOSPITAL_COMMUNITY)
Admission: RE | Disposition: A | Discharge status: Home / Self Care | Payer: Self-pay | Source: Ambulatory Visit | Attending: Surgery

## 2011-08-28 ENCOUNTER — Encounter (HOSPITAL_COMMUNITY): Payer: Self-pay | Admitting: Vascular Surgery

## 2011-08-28 ENCOUNTER — Ambulatory Visit (HOSPITAL_COMMUNITY): Payer: 59 | Admitting: Vascular Surgery

## 2011-08-28 DIAGNOSIS — E039 Hypothyroidism, unspecified: Secondary | ICD-10-CM | POA: Insufficient documentation

## 2011-08-28 DIAGNOSIS — K439 Ventral hernia without obstruction or gangrene: Secondary | ICD-10-CM

## 2011-08-28 DIAGNOSIS — R51 Headache: Secondary | ICD-10-CM | POA: Insufficient documentation

## 2011-08-28 DIAGNOSIS — K436 Other and unspecified ventral hernia with obstruction, without gangrene: Secondary | ICD-10-CM | POA: Insufficient documentation

## 2011-08-28 DIAGNOSIS — E785 Hyperlipidemia, unspecified: Secondary | ICD-10-CM | POA: Insufficient documentation

## 2011-08-28 HISTORY — PX: HERNIA REPAIR: SHX51

## 2011-08-28 HISTORY — PX: VENTRAL HERNIA REPAIR: SHX424

## 2011-08-28 LAB — HEPATIC FUNCTION PANEL
ALT: 19 U/L (ref 0–35)
Albumin: 3.4 g/dL — ABNORMAL LOW (ref 3.5–5.2)
Alkaline Phosphatase: 72 U/L (ref 39–117)
Total Protein: 7.2 g/dL (ref 6.0–8.3)

## 2011-08-28 SURGERY — REPAIR, HERNIA, VENTRAL
Anesthesia: General | Site: Abdomen | Wound class: Clean

## 2011-08-28 MED ORDER — ONDANSETRON HCL 4 MG/2ML IJ SOLN
INTRAMUSCULAR | Status: DC | PRN
  Administered 2011-08-28: 4 mg via INTRAVENOUS

## 2011-08-28 MED ORDER — BUPIVACAINE-EPINEPHRINE 0.25% -1:200000 IJ SOLN
INTRAMUSCULAR | Status: DC | PRN
  Administered 2011-08-28: 10 mL

## 2011-08-28 MED ORDER — DEXAMETHASONE SODIUM PHOSPHATE 4 MG/ML IJ SOLN
INTRAMUSCULAR | Status: DC | PRN
  Administered 2011-08-28: 8 mg via INTRAVENOUS

## 2011-08-28 MED ORDER — OXYCODONE-ACETAMINOPHEN 5-325 MG PO TABS
ORAL_TABLET | ORAL | Status: AC
  Filled 2011-08-28: qty 1

## 2011-08-28 MED ORDER — HYDROMORPHONE HCL PF 1 MG/ML IJ SOLN
0.2500 mg | INTRAMUSCULAR | Status: DC | PRN
  Administered 2011-08-28 (×2): 0.5 mg via INTRAVENOUS

## 2011-08-28 MED ORDER — FENTANYL CITRATE 0.05 MG/ML IJ SOLN
INTRAMUSCULAR | Status: DC | PRN
  Administered 2011-08-28: 25 ug via INTRAVENOUS
  Administered 2011-08-28: 75 ug via INTRAVENOUS
  Administered 2011-08-28: 100 ug via INTRAVENOUS
  Administered 2011-08-28: 50 ug via INTRAVENOUS

## 2011-08-28 MED ORDER — OXYCODONE-ACETAMINOPHEN 5-325 MG PO TABS: 1.0000 | ORAL_TABLET | ORAL | Status: AC | PRN

## 2011-08-28 MED ORDER — ONDANSETRON HCL 4 MG/2ML IJ SOLN: 4.0000 mg | INTRAMUSCULAR | Status: DC | PRN

## 2011-08-28 MED ORDER — MIDAZOLAM HCL 5 MG/5ML IJ SOLN
INTRAMUSCULAR | Status: DC | PRN
  Administered 2011-08-28: 2 mg via INTRAVENOUS

## 2011-08-28 MED ORDER — MORPHINE SULFATE 2 MG/ML IJ SOLN: 2.0000 mg | INTRAMUSCULAR | Status: DC | PRN

## 2011-08-28 MED ORDER — SODIUM CHLORIDE 0.9 % IR SOLN
Status: DC | PRN
  Administered 2011-08-28: 1000 mL

## 2011-08-28 MED ORDER — LACTATED RINGERS IV SOLN
INTRAVENOUS | Status: DC | PRN
  Administered 2011-08-28 (×2): via INTRAVENOUS

## 2011-08-28 MED ORDER — CEFAZOLIN SODIUM 1-5 GM-% IV SOLN
INTRAVENOUS | Status: DC | PRN
  Administered 2011-08-28: 2 g via INTRAVENOUS

## 2011-08-28 MED ORDER — LIDOCAINE HCL (CARDIAC) 20 MG/ML IV SOLN
INTRAVENOUS | Status: DC | PRN
  Administered 2011-08-28: 60 mg via INTRAVENOUS

## 2011-08-28 MED ORDER — OXYCODONE-ACETAMINOPHEN 5-325 MG PO TABS
1.0000 | ORAL_TABLET | ORAL | Status: DC | PRN
  Administered 2011-08-28: 1 via ORAL

## 2011-08-28 MED ORDER — PROPOFOL 10 MG/ML IV EMUL
INTRAVENOUS | Status: DC | PRN
  Administered 2011-08-28: 200 mg via INTRAVENOUS

## 2011-08-28 MED ORDER — MORPHINE SULFATE 10 MG/ML IJ SOLN
INTRAMUSCULAR | Status: DC | PRN
  Administered 2011-08-28 (×2): 5 mg via INTRAVENOUS

## 2011-08-28 SURGICAL SUPPLY — 47 items
ADH SKN CLS APL DERMABOND .7 (GAUZE/BANDAGES/DRESSINGS) ×1
APL SKNCLS STERI-STRIP NONHPOA (GAUZE/BANDAGES/DRESSINGS) ×1
BENZOIN TINCTURE PRP APPL 2/3 (GAUZE/BANDAGES/DRESSINGS) ×1 IMPLANT
BLADE SURG ROTATE 9660 (MISCELLANEOUS) ×1 IMPLANT
CANISTER SUCTION 2500CC (MISCELLANEOUS) ×2 IMPLANT
CHLORAPREP W/TINT 26ML (MISCELLANEOUS) ×1 IMPLANT
CLOTH BEACON ORANGE TIMEOUT ST (SAFETY) ×2 IMPLANT
COVER SURGICAL LIGHT HANDLE (MISCELLANEOUS) ×2 IMPLANT
DERMABOND ADVANCED (GAUZE/BANDAGES/DRESSINGS) ×1
DERMABOND ADVANCED .7 DNX12 (GAUZE/BANDAGES/DRESSINGS) IMPLANT
DRAPE LAPAROSCOPIC ABDOMINAL (DRAPES) ×2 IMPLANT
DRAPE UTILITY 15X26 W/TAPE STR (DRAPE) ×4 IMPLANT
DRSG TEGADERM 4X4.75 (GAUZE/BANDAGES/DRESSINGS) ×1 IMPLANT
ELECT CAUTERY BLADE 6.4 (BLADE) ×2 IMPLANT
ELECT REM PT RETURN 9FT ADLT (ELECTROSURGICAL) ×2
ELECTRODE REM PT RTRN 9FT ADLT (ELECTROSURGICAL) ×1 IMPLANT
GLOVE BIO SURGEON STRL SZ7 (GLOVE) ×2 IMPLANT
GLOVE BIO SURGEON STRL SZ8 (GLOVE) ×1 IMPLANT
GLOVE BIOGEL PI IND STRL 6.5 (GLOVE) IMPLANT
GLOVE BIOGEL PI IND STRL 7.5 (GLOVE) ×1 IMPLANT
GLOVE BIOGEL PI INDICATOR 6.5 (GLOVE) ×1
GLOVE BIOGEL PI INDICATOR 7.5 (GLOVE) ×1
GLOVE ECLIPSE 6.5 STRL STRAW (GLOVE) ×1 IMPLANT
GOWN STRL NON-REIN LRG LVL3 (GOWN DISPOSABLE) ×5 IMPLANT
KIT BASIN OR (CUSTOM PROCEDURE TRAY) ×2 IMPLANT
KIT ROOM TURNOVER OR (KITS) ×2 IMPLANT
MESH VENTRALEX ST 1-7/10 CRC S (Mesh General) ×1 IMPLANT
NDL HYPO 25GX1X1/2 BEV (NEEDLE) ×1 IMPLANT
NEEDLE HYPO 25GX1X1/2 BEV (NEEDLE) ×2 IMPLANT
NS IRRIG 1000ML POUR BTL (IV SOLUTION) ×2 IMPLANT
PACK GENERAL/GYN (CUSTOM PROCEDURE TRAY) ×2 IMPLANT
PAD ARMBOARD 7.5X6 YLW CONV (MISCELLANEOUS) ×3 IMPLANT
SPONGE GAUZE 4X4 12PLY (GAUZE/BANDAGES/DRESSINGS) ×1 IMPLANT
STAPLER VISISTAT 35W (STAPLE) IMPLANT
STRIP CLOSURE SKIN 1/2X4 (GAUZE/BANDAGES/DRESSINGS) ×1 IMPLANT
SUT MNCRL AB 4-0 PS2 18 (SUTURE) ×2 IMPLANT
SUT NOVA NAB GS-21 0 18 T12 DT (SUTURE) ×1 IMPLANT
SUT NOVA NAB GS-21 1 T12 (SUTURE) ×1 IMPLANT
SUT VIC AB 3-0 SH 27 (SUTURE) ×4
SUT VIC AB 3-0 SH 27X BRD (SUTURE) IMPLANT
SUT VIC AB 3-0 SH 27XBRD (SUTURE) ×1 IMPLANT
SYR CONTROL 10ML LL (SYRINGE) ×2 IMPLANT
TOWEL OR 17X24 6PK STRL BLUE (TOWEL DISPOSABLE) ×1 IMPLANT
TOWEL OR 17X26 10 PK STRL BLUE (TOWEL DISPOSABLE) ×2 IMPLANT
TOWEL OR NON WOVEN STRL DISP B (DISPOSABLE) ×1 IMPLANT
TRAY FOLEY CATH 14FRSI W/METER (CATHETERS) IMPLANT
WATER STERILE IRR 1000ML POUR (IV SOLUTION) IMPLANT

## 2011-08-28 NOTE — Transfer of Care (Signed)
Immediate Anesthesia Transfer of Care Note  Patient: Lindsey Boone  Procedure(s) Performed: Procedure(s) (LRB): HERNIA REPAIR VENTRAL ADULT (N/A) INSERTION OF MESH (N/A)  Patient Location: PACU  Anesthesia Type: General  Level of Consciousness: awake, alert  and oriented  Airway & Oxygen Therapy: Patient Spontanous Breathing  Post-op Assessment: Report given to PACU RN and Post -op Vital signs reviewed and stable  Post vital signs: Reviewed and stable  Complications: No apparent anesthesia complications

## 2011-08-28 NOTE — H&P (View-Only) (Signed)
Patient ID: Lindsey Boone, female   DOB: 1970-11-25, 41 y.o.   MRN: [id number]  Chief Complaint  Patient presents with  . Hernia    Adbominal wall    HPI Lindsey Boone is a 41 y.o. female.  PCP is Dr. Herb Grays HPI The patient was seen last year for a ventral hernia. 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 The ventral hernia has enlarged and become more uncomfortable. It remains mostly reducible.  This patient is status post laparoscopic cholecystectomy by Dr. Abbey Chatters in April 2003. She had a postoperative pulmonary embolus and is anticoagulated. This is managed by Dr. Cephas Darby. She also developed drug-induced hepatitis managed by Dr. Carman Ching. Otherwise her health is unchanged from last year. Past Medical History  Diagnosis Date  . Migraine   . Hypothyroid   . Iron deficiency anemia, unspecified   . Hyperlipemia   . Edema   . Clotting disorder   . Abdominal wall hernia   . Leg swelling   . Abdominal distension   . Nausea     Past Surgical History  Procedure Date  . Umbilical hernia repair     w/o obst or gangrene  . Cesarean section 08/20/96  . Cholecystectomy 09/2001    History reviewed. No pertinent family history. Father - non-Hodgkins lymphoma  Social History History  Substance Use Topics  . Smoking status: Never Smoker   . Smokeless tobacco: Never Used  . Alcohol Use: No    Allergies  Allergen Reactions  . Sulfa Antibiotics Hives    Upper torso only.    Current Outpatient Prescriptions  Medication Sig Dispense Refill  . metroNIDAZOLE (METROCREAM) 0.75 % cream Daily.      . fish oil-omega-3 fatty acids 1000 MG capsule Take 1 g by mouth daily. Take 4 tablets daily; 1 gm each       . levothyroxine (SYNTHROID, LEVOTHROID) 125 MCG tablet Take 125 mcg by mouth daily.        . Multiple Vitamin  (MULTIVITAMIN) capsule Take 1 capsule by mouth daily.        Marland Kitchen warfarin (COUMADIN) 5 MG tablet Take daily as directed  90 tablet  4    Review of Systems Review of Systems  Constitutional: Negative for fever, chills and unexpected weight change.  HENT: Negative for hearing loss, congestion, sore throat, trouble swallowing and voice change.   Eyes: Negative for visual disturbance.  Respiratory: Negative for cough and wheezing.   Cardiovascular: Positive for leg swelling. Negative for chest pain and palpitations.  Gastrointestinal: Positive for nausea, abdominal pain and abdominal distention. Negative for vomiting, diarrhea, constipation, blood in stool and anal bleeding.  Genitourinary: Negative for hematuria, vaginal bleeding and difficulty urinating.  Musculoskeletal: Negative for arthralgias.  Skin: Negative for rash and wound.  Neurological: Negative for seizures, syncope and headaches.  Hematological: Negative for adenopathy. Does not bruise/bleed easily.  Psychiatric/Behavioral: Negative for confusion.    Blood pressure 134/96, pulse 70, temperature 97.8 F (36.6 C), temperature source Temporal, resp. rate 18, height 5\' 4"  (1.626 m), weight 224 lb 8 oz (101.833 kg).  Physical Exam Physical Exam WDWN in NAD HEENT:  EOMI, sclera anicteric Neck:  No masses, no thyromegaly Lungs:  CTA bilaterally; normal respiratory effort CV:  Regular rate and rhythm; no murmurs Abd:  +bowel sounds, soft, non-tender, healed laparoscopic incisions.  Visible bulge at the upper edge of her umbilicus.  Above the umbilicus, there is a 4 cm bulge which is partially reducible.  No skin changes Ext:  Well-perfused; no edema Skin:  Warm, dry; no sign of jaundice  Data Reviewed none  Assessment    Enlarging ventral hernia - supraumbilical    Plan    Open ventral hernia repair with mesh.  The surgical procedure has been discussed with the patient.  Potential risks, benefits, alternative treatments,  and expected outcomes have been explained.  All of the patient's questions at this time have been answered.  The likelihood of reaching the patient's treatment goal is good.  The patient understand the proposed surgical procedure and wishes to proceed. We have a clearance letter from Dr. Cyndie Chime stating that she is OK to stop her Coumadin for 3 days prior to surgery.  No need for Lovenox bridge.       Holmes Hays K. 08/10/2011, 11:53 AM

## 2011-08-28 NOTE — Interval H&P Note (Signed)
History and Physical Interval Note:  08/28/2011 7:22 AM  Lindsey Boone  has presented today for surgery, with the diagnosis of ventral hernia  The various methods of treatment have been discussed with the patient and family. After consideration of risks, benefits and other options for treatment, the patient has consented to  Procedure(s) (LRB): HERNIA REPAIR VENTRAL ADULT (N/A) INSERTION OF MESH (N/A) as a surgical intervention .  The patients' history has been reviewed, patient examined, no change in status, stable for surgery.  I have reviewed the patients' chart and labs.  Questions were answered to the patient's satisfaction.     Tien Spooner K.

## 2011-08-28 NOTE — Anesthesia Postprocedure Evaluation (Signed)
  Anesthesia Post-op Note  Patient: Lindsey Boone  Procedure(s) Performed: Procedure(s) (LRB): HERNIA REPAIR VENTRAL ADULT (N/A) INSERTION OF MESH (N/A)  Patient Location: PACU  Anesthesia Type: General  Level of Consciousness: awake  Airway and Oxygen Therapy: Patient Spontanous Breathing  Post-op Pain: mild  Post-op Assessment: Post-op Vital signs reviewed, Patient's Cardiovascular Status Stable, Respiratory Function Stable and Patent Airway  Post-op Vital Signs: Reviewed and stable  Complications: No apparent anesthesia complications

## 2011-08-28 NOTE — Op Note (Signed)
Ventral Hernia Repair Procedure Note  Indications: Symptomatic ventral hernia  Pre-operative Diagnosis: Ventral hernia - supraumbilical  Post-operative Diagnosis: Ventral hernia  Surgeon: Iman Orourke K.   Assistants: none  Anesthesia: General LMA anesthesia  ASA Class: 2  Procedure Details  The patient was seen in the Holding Room. The risks, benefits, complications, treatment options, and expected outcomes were discussed with the patient. The possibilities of reaction to medication, pulmonary aspiration, perforation of viscus, bleeding, recurrent infection, the need for additional procedures, failure to diagnose a condition, and creating a complication requiring transfusion or operation were discussed with the patient. The patient concurred with the proposed plan, giving informed consent.  The site of surgery properly noted/marked. The patient was taken to the operating room, identified as Lindsey Boone and the procedure verified as ventral hernia repair. A Time Out was held and the above information confirmed.  The patient was placed supine.  After establishing general anesthesia, the abdomen was prepped with Chloraprep and draped in sterile fashion.  We made a vertical incision over the palpable hernia which was located just above the umbilicus. Dissection was carried down to the hernia sac located above the fascia and mobilized from surrounding structures.  The hernia sac was densely adherent to the posterior surface of the umbilicus and contained some incarcerated omentum.  We opened the hernia sac and reduced the omentum.    Intact fascia was identified circumferentially around the defect.  We used a 4.3 cm Ventralex mesh and secured this to the fascia with interrupted 0 Novofil sutures.  The fascial defect was reapproximated with interrupted figure-of-8 1 Novofil sutures.  The subcutaneous tissues were irrigated.  We closed the posterior surface of the umbilical skin with 3-0 Vicryl   Dermabond was used to seal the skin deep within the umbilicus.  3-0 Vicryl was used to close the subcutaneous tissues of the skin incision.  The skin incision was closed with a 4-0 subcuticular closure.  Hemostasis was confirmed.    Steri-Strips were applied at the end of the operation.    Instrument, sponge, and needle counts were correct prior to closure and at the conclusion of the case.   Findings: 1.5 cm defect with incarcerated omentum  Estimated Blood Loss:  Minimal         Drains: none                      Complications:  None; patient tolerated the procedure well.         Disposition: PACU - hemodynamically stable.         Condition: stable  [name]. [name], MD, [facility]  08/28/2011 8:56 AM

## 2011-08-28 NOTE — Preoperative (Signed)
Beta Blockers   Reason not to administer Beta Blockers:Not Applicable 

## 2011-08-28 NOTE — Anesthesia Preprocedure Evaluation (Addendum)
Anesthesia Evaluation  Patient identified by MRN, date of birth, ID band Patient awake    Reviewed: Allergy & Precautions, H&P , NPO status , Patient's Chart, lab work & pertinent test results, reviewed documented beta blocker date and time   History of Anesthesia Complications Negative for: history of anesthetic complications  Airway Mallampati: II TM Distance: >3 FB Neck ROM: Full    Dental  (+) Teeth Intact and Dental Advisory Given   Pulmonary neg pulmonary ROS,  breath sounds clear to auscultation        Cardiovascular negative cardio ROS  Rhythm:Regular Rate:Normal     Neuro/Psych  Headaches, negative psych ROS   GI/Hepatic Neg liver ROS,   Endo/Other  Hypothyroidism   Renal/GU negative Renal ROS  negative genitourinary   Musculoskeletal negative musculoskeletal ROS (+)   Abdominal   Peds negative pediatric ROS (+)  Hematology negative hematology ROS (+)   Anesthesia Other Findings   Reproductive/Obstetrics negative OB ROS                         Anesthesia Physical Anesthesia Plan  ASA: II  Anesthesia Plan: General   Post-op Pain Management:    Induction: Intravenous  Airway Management Planned: Oral ETT  Additional Equipment:   Intra-op Plan:   Post-operative Plan: Extubation in OR  Informed Consent: I have reviewed the patients History and Physical, chart, labs and discussed the procedure including the risks, benefits and alternatives for the proposed anesthesia with the patient or authorized representative who has indicated his/her understanding and acceptance.   Dental advisory given  Plan Discussed with: Anesthesiologist  Anesthesia Plan Comments:         Anesthesia Quick Evaluation

## 2011-08-29 ENCOUNTER — Encounter (HOSPITAL_COMMUNITY): Payer: Self-pay | Admitting: Surgery

## 2011-08-30 ENCOUNTER — Telehealth (INDEPENDENT_AMBULATORY_CARE_PROVIDER_SITE_OTHER): Payer: Self-pay | Admitting: Surgery

## 2011-09-03 ENCOUNTER — Other Ambulatory Visit (HOSPITAL_BASED_OUTPATIENT_CLINIC_OR_DEPARTMENT_OTHER): Payer: 59 | Admitting: Lab

## 2011-09-03 ENCOUNTER — Ambulatory Visit (HOSPITAL_BASED_OUTPATIENT_CLINIC_OR_DEPARTMENT_OTHER): Payer: 59 | Admitting: Pharmacist

## 2011-09-03 DIAGNOSIS — D500 Iron deficiency anemia secondary to blood loss (chronic): Secondary | ICD-10-CM

## 2011-09-03 DIAGNOSIS — O88219 Thromboembolism in pregnancy, unspecified trimester: Secondary | ICD-10-CM

## 2011-09-03 DIAGNOSIS — Z7901 Long term (current) use of anticoagulants: Secondary | ICD-10-CM

## 2011-09-03 DIAGNOSIS — Z5181 Encounter for therapeutic drug level monitoring: Secondary | ICD-10-CM

## 2011-09-03 LAB — PROTIME-INR: INR: 2.2 (ref 2.00–3.50)

## 2011-09-03 NOTE — Progress Notes (Signed)
Pt took Lovenox injection this morning. OK to discontinue Lovenox. INR within goal today.  Continue Coumadin 7.5mg  daily except 10mg  on MWF. This has been a stable dose for patient. Recheck INR in 4 weeks at [facility] on 10/02/11 @ 11:30am.

## 2011-09-03 NOTE — Patient Instructions (Signed)
Pt took Lovenox injection this morning. OK to discontinue Lovenox. INR within goal today.  Continue Coumadin 7.5mg  daily except 10mg  on MWF. Recheck INR in 4 weeks at [facility] on 10/02/11 @ 11:30am.

## 2011-09-11 ENCOUNTER — Encounter (INDEPENDENT_AMBULATORY_CARE_PROVIDER_SITE_OTHER): Payer: Self-pay | Admitting: Surgery

## 2011-09-13 ENCOUNTER — Ambulatory Visit (INDEPENDENT_AMBULATORY_CARE_PROVIDER_SITE_OTHER): Payer: 59 | Admitting: Surgery

## 2011-09-13 ENCOUNTER — Encounter (INDEPENDENT_AMBULATORY_CARE_PROVIDER_SITE_OTHER): Payer: Self-pay | Admitting: Surgery

## 2011-09-13 VITALS — BP 122/83 | HR 86 | Temp 98.6°F | Resp 16 | Ht 64.0 in | Wt 220.8 lb

## 2011-09-13 DIAGNOSIS — K439 Ventral hernia without obstruction or gangrene: Secondary | ICD-10-CM

## 2011-09-13 NOTE — Patient Instructions (Signed)
Apply neosporin and cover your umbilicus with dry gauze daily after your showers.

## 2011-09-13 NOTE — Progress Notes (Signed)
S/p Open ventral hernia repair with mesh on 08/28/11 for a small supraumbilical ventral hernia.  The hernia defect was only about 1.5 cm across, but the hernia sac was several centimeters across and densely adherent to the umbilical skin.  There was some button-holing of the umbilical skin, which was sealed with Dermabond.  The patient feels quite well.  Minimal soreness.  The incision is healing without any sign of infection.  Deep within the umbilicus - there is a small skin opening.  The dermabond has sloughed off.  The skin appears healthy and free of cellulitis.  Minimal drainage.  The patient should treat this area with peroxide and neosporin ointment each day.  Follow-up in 2 weeks for recheck.  She may begin increasing her level of activity.  She returns to work in 1 1/2 weeks.  [name]. [name], MD, [facility]  09/13/2011 9:42 AM

## 2011-09-18 ENCOUNTER — Telehealth (INDEPENDENT_AMBULATORY_CARE_PROVIDER_SITE_OTHER): Payer: Self-pay | Admitting: General Surgery

## 2011-09-18 NOTE — Telephone Encounter (Signed)
Pt calling to ask how long to expect drainage from umbilical incision.  Dr. [name] is aware of it; using Neosporin and gauze to site.  Drainage is thin and brown, without odor.  Reassured pt that when a problem is on-going, progress is hard to assess, especially when it may be diminishing slowly.  Continue treatment as outlined by physician and keep appt for recheck.  Call back is symptoms of infection: increased tenderness/ redness at site, new onset of fever or drainage becomes thick/ purulent.  She understands and will comply.

## 2011-09-21 ENCOUNTER — Telehealth (INDEPENDENT_AMBULATORY_CARE_PROVIDER_SITE_OTHER): Payer: Self-pay | Admitting: General Surgery

## 2011-09-21 ENCOUNTER — Ambulatory Visit (INDEPENDENT_AMBULATORY_CARE_PROVIDER_SITE_OTHER): Payer: 59 | Admitting: Surgery

## 2011-09-21 ENCOUNTER — Encounter (INDEPENDENT_AMBULATORY_CARE_PROVIDER_SITE_OTHER): Payer: Self-pay | Admitting: Surgery

## 2011-09-21 VITALS — BP 127/86 | HR 84 | Temp 99.0°F | Resp 18 | Ht 64.0 in | Wt 223.8 lb

## 2011-09-21 DIAGNOSIS — K439 Ventral hernia without obstruction or gangrene: Secondary | ICD-10-CM

## 2011-09-21 MED ORDER — CEPHALEXIN 250 MG PO CAPS: 250.0000 mg | ORAL_CAPSULE | Freq: Four times a day (QID) | ORAL | Status: AC

## 2011-09-21 NOTE — Progress Notes (Signed)
The patient called today with report of a large amount of serous drainage from her umbilicus. We brought her in to our afternoon office.  There is no sign of cellulitis. The drainage is clear and serous. The opening at the base of her umbilicus is quite small. It seems smaller than my last examination. Apparently she had a seroma at her old hernia site which has drained through her umbilicus. We will pack the umbilicus with gauze which will be changed daily. We will recheck her next week. We will also treat her with a week of Keflex.  [name]. [name], MD, [facility]  09/21/2011 6:02 PM

## 2011-09-21 NOTE — Telephone Encounter (Signed)
Patient is post ventral hernia repair on 08/28/11. She was checked last week and had minimal drainage. Patient states now at her umbilicus she had it covered with gauze and tape this am at 9:30. She has not changed the gauze and is now having fluid leak through her clothing. Please advise. [phone].

## 2011-09-26 ENCOUNTER — Encounter (INDEPENDENT_AMBULATORY_CARE_PROVIDER_SITE_OTHER): Payer: Self-pay | Admitting: General Surgery

## 2011-09-27 ENCOUNTER — Encounter (INDEPENDENT_AMBULATORY_CARE_PROVIDER_SITE_OTHER): Payer: Self-pay | Admitting: Surgery

## 2011-09-27 ENCOUNTER — Ambulatory Visit (INDEPENDENT_AMBULATORY_CARE_PROVIDER_SITE_OTHER): Payer: 59 | Admitting: Surgery

## 2011-09-27 VITALS — BP 130/78 | HR 84 | Temp 97.6°F | Resp 18 | Ht 64.0 in | Wt 225.0 lb

## 2011-09-27 DIAGNOSIS — K439 Ventral hernia without obstruction or gangrene: Secondary | ICD-10-CM

## 2011-09-27 NOTE — Progress Notes (Signed)
There is much less drainage and the visible drainage is clean with no sign of purulence.  The incision is well-healed with no sign of infection.  The opening in the bottom of her umbilicus is quite small.  No erythema or purulence.  No odor.  Continue cleaning the umbilicus daily and using Neosporin/ dry dressing.  Follow-up 1-2 weeks.  Resume full activity.  [name]. [name], MD, [facility]  09/27/2011 11:16 AM

## 2011-10-02 ENCOUNTER — Other Ambulatory Visit (HOSPITAL_BASED_OUTPATIENT_CLINIC_OR_DEPARTMENT_OTHER): Payer: 59 | Admitting: Lab

## 2011-10-02 ENCOUNTER — Ambulatory Visit: Payer: Self-pay | Admitting: Pharmacist

## 2011-10-02 DIAGNOSIS — O88219 Thromboembolism in pregnancy, unspecified trimester: Secondary | ICD-10-CM

## 2011-10-02 LAB — PROTIME-INR (CHCC SATELLITE)

## 2011-10-02 NOTE — Progress Notes (Signed)
INR = 2.5 (drawn at our [facility] office) Pt taking 10 mg WMF; 7.5 mg other days.  Doing well w/o complaints. Cont same dose & repeat lab in 1 month at [facility]. Pt aware of plan; I s/w her over phone today. Marily Lente, Pharm.D.

## 2011-10-11 ENCOUNTER — Ambulatory Visit (INDEPENDENT_AMBULATORY_CARE_PROVIDER_SITE_OTHER): Payer: 59 | Admitting: Surgery

## 2011-10-11 ENCOUNTER — Encounter (INDEPENDENT_AMBULATORY_CARE_PROVIDER_SITE_OTHER): Payer: Self-pay | Admitting: Surgery

## 2011-10-11 VITALS — BP 110/88 | HR 80 | Temp 99.5°F | Resp 16 | Ht 64.0 in | Wt 226.8 lb

## 2011-10-11 DIAGNOSIS — K439 Ventral hernia without obstruction or gangrene: Secondary | ICD-10-CM

## 2011-10-11 NOTE — Progress Notes (Signed)
Her umbilical wound is healed. No further drainage.  No sign of cellulitis.  No soreness.  Resume full activity. Follow-up PRN.  [name]. [name], MD, [facility] Surgery  10/11/2011 9:24 AM

## 2011-10-12 ENCOUNTER — Encounter (INDEPENDENT_AMBULATORY_CARE_PROVIDER_SITE_OTHER): Payer: 59 | Admitting: Surgery

## 2011-10-30 ENCOUNTER — Ambulatory Visit: Payer: Self-pay | Admitting: Pharmacist

## 2011-10-30 ENCOUNTER — Other Ambulatory Visit (HOSPITAL_BASED_OUTPATIENT_CLINIC_OR_DEPARTMENT_OTHER): Payer: 59 | Admitting: Lab

## 2011-10-30 DIAGNOSIS — O88219 Thromboembolism in pregnancy, unspecified trimester: Secondary | ICD-10-CM

## 2011-10-30 DIAGNOSIS — Z7901 Long term (current) use of anticoagulants: Secondary | ICD-10-CM

## 2011-10-30 LAB — PROTIME-INR (CHCC SATELLITE): INR: 2.8 (ref 2.0–3.5)

## 2011-10-30 NOTE — Progress Notes (Signed)
INR = 2.8 on 7.5 mg/day; 10 mg MWF Stable w/o complaints. No change to Coumadin dose. Repeat INR in 6 weeks at [facility]. Marily Lente, Pharm.D.

## 2011-10-31 ENCOUNTER — Telehealth: Payer: Self-pay | Admitting: Oncology

## 2011-10-31 NOTE — Telephone Encounter (Signed)
Pt called left message, returned call, pt wants appt moved to September, nurse for Dr. Reece Agar notified

## 2011-11-12 ENCOUNTER — Ambulatory Visit: Payer: BC Managed Care – PPO | Admitting: Oncology

## 2011-12-11 ENCOUNTER — Other Ambulatory Visit: Payer: 59 | Admitting: Lab

## 2011-12-19 ENCOUNTER — Other Ambulatory Visit (HOSPITAL_BASED_OUTPATIENT_CLINIC_OR_DEPARTMENT_OTHER): Payer: 59 | Admitting: Lab

## 2011-12-19 ENCOUNTER — Ambulatory Visit (HOSPITAL_BASED_OUTPATIENT_CLINIC_OR_DEPARTMENT_OTHER): Payer: 59 | Admitting: Pharmacist

## 2011-12-19 DIAGNOSIS — O88219 Thromboembolism in pregnancy, unspecified trimester: Secondary | ICD-10-CM

## 2011-12-19 DIAGNOSIS — Z7901 Long term (current) use of anticoagulants: Secondary | ICD-10-CM

## 2011-12-19 DIAGNOSIS — Z86718 Personal history of other venous thrombosis and embolism: Secondary | ICD-10-CM

## 2011-12-19 LAB — PROTIME-INR (CHCC SATELLITE): INR: 3.3 (ref 2.0–3.5)

## 2011-12-19 NOTE — Progress Notes (Signed)
INR close to goal of 2-3.  Pt started taking fish oil and no other medication/medical changes.  Has appt with Dr. Cyndie Chime on 01/04/12 and we will see pt in coumadin clinic at this time.  Then, resume INR checks every 6-8 weeks if stable.

## 2012-01-04 ENCOUNTER — Other Ambulatory Visit (HOSPITAL_BASED_OUTPATIENT_CLINIC_OR_DEPARTMENT_OTHER): Payer: 59 | Admitting: Lab

## 2012-01-04 ENCOUNTER — Ambulatory Visit (HOSPITAL_BASED_OUTPATIENT_CLINIC_OR_DEPARTMENT_OTHER): Payer: 59 | Admitting: Oncology

## 2012-01-04 ENCOUNTER — Ambulatory Visit: Payer: 59 | Admitting: Pharmacist

## 2012-01-04 ENCOUNTER — Encounter: Payer: Self-pay | Admitting: Oncology

## 2012-01-04 ENCOUNTER — Telehealth: Payer: Self-pay | Admitting: Pharmacist

## 2012-01-04 DIAGNOSIS — E0500 Thyrotoxicosis with diffuse goiter without thyrotoxic crisis or storm: Secondary | ICD-10-CM

## 2012-01-04 DIAGNOSIS — O88219 Thromboembolism in pregnancy, unspecified trimester: Secondary | ICD-10-CM

## 2012-01-04 DIAGNOSIS — D649 Anemia, unspecified: Secondary | ICD-10-CM

## 2012-01-04 DIAGNOSIS — Z86711 Personal history of pulmonary embolism: Secondary | ICD-10-CM

## 2012-01-04 LAB — PROTIME-INR: INR: 2.2 (ref 2.00–3.50)

## 2012-01-04 NOTE — Telephone Encounter (Signed)
Message copied by Jacqlyn Larsen on Fri Jan 04, 2012  3:22 PM ------      Message from: Sherilyn Dacosta      Created: Fri Jan 04, 2012 12:20 PM      Regarding: RE: anticoag lab appt request       02-01-12 AT 11:15 am      ----- Message -----         From: Davene Costain [place], [place]         Sent: 01/04/2012  12:10 PM           To: Sherilyn Dacosta, [facility]      Subject: anticoag lab appt request                                Hi Raiford Noble,            Could you schedule Lindsey Boone (MRN [id number]) for a lab visit on 02/01/12 near 11am?  Please let me know what is available so I can inform the patient.  Thanks!            Herbert Seta

## 2012-01-04 NOTE — Telephone Encounter (Signed)
Called to confirm appt time with patient.  Since she has been discontinued off Coumadin, she does not need to keep this appt.  Will cancel with Rick in [place].

## 2012-01-04 NOTE — Progress Notes (Signed)
Hematology and Oncology Follow Up Visit  Lindsey Boone [id number] Feb 24, 1971 41 y.o. 01/04/2012 2:45 PM   Principle Diagnosis: Encounter Diagnoses  Name Primary?  . Personal history of pulmonary embolism Yes  . Thyroiditis, autoimmune   . Hypothyroidism following radioiodine therapy      Interim History:   Followup visit for this pleasant 41 year old woman who sustained  unprovoked bilateral pulmonary emboli 10 years ago in June of 2003. This occurred about 3 weeks after gallbladder surgery and while she was under evaluation for active autoimmune thyroid disease. She was found to have Graves' disease. Subsequent treatment with PTU and ultimately radioactive iodine. No family history of blood clots. Hypercoagulation evaluation unremarkable. I have kept her on therapeutic Coumadin now for 10 years. She has had no subsequent thrombotic events.  She is doing well. She has had no interim medical problems. She had a ventral hernia repair by Dr. [name]  in April of this year.  She denies any chest pain, dyspnea, she does get occasional palpitations. Some intermittent leg swelling right greater than left but no pain issues up on her feet for any length of time.  Medications: reviewed  Allergies:  Allergies  Allergen Reactions  . Sulfa Antibiotics Hives    Upper torso only.    Review of Systems: Constitutional:   No constitutional symptoms Respiratory: See above Cardiovascular:  See above Gastrointestinal: No change in bowel habit Genito-Urinary: No abnormal vaginal bleeding Musculoskeletal: No muscle or bone pain Neurologic: No headache or change in vision Skin: No rash or ecchymosis Remaining ROS negative.  Physical Exam: Blood pressure 128/89, pulse 77, temperature 98.5 F (36.9 C), temperature source Oral, resp. rate 18, height 5\' 4"  (1.626 m), weight 228 lb 4.8 oz (103.556 kg). Wt Readings from Last 3 Encounters:  01/04/12 228 lb 4.8 oz (103.556 kg)  10/11/11 226 lb 12.8 oz  (102.876 kg)  09/27/11 225 lb (102.059 kg)     General appearance: Overweight Caucasian woman HENNT: Pharynx no erythema or exudate Lymph nodes: No lymphadenopathy Breasts: Lungs: Clear to auscultation resonant to percussion Heart: Regular rhythm no murmur Abdomen: Soft nontender no mass no organomegaly Extremities: 3 cm discrepancy right calf greater than left 49 cm versus 46 cm nontender Vascular: No cyanosis Neurologic: Motor strength 5 over 5, reflexes 1+ symmetric Skin: No rash or ecchymosis  Lab Results: Lab Results  Component Value Date   WBC 8.8 08/22/2011   HGB 13.0 08/22/2011   HCT 40.6 08/22/2011   MCV 75.9* 08/22/2011   PLT 308 08/22/2011     Chemistry      Component Value Date/Time   NA 137 08/22/2011 0856   K 3.8 08/22/2011 0856   CL 101 08/22/2011 0856   CO2 25 08/22/2011 0856   BUN 12 08/22/2011 0856   CREATININE 0.64 08/22/2011 0856      Component Value Date/Time   CALCIUM 9.2 08/22/2011 0856   ALKPHOS 72 08/28/2011 0607   AST 16 08/28/2011 0607   ALT 19 08/28/2011 0607   BILITOT 1.2 08/28/2011 0607      Impression and Plan:  #1. Unprovoked pulmonary emboli 10 years ago with risk factors as outlined above. I told her I feel comfortable stopping her anticoagulation at this time. I'm going to have her start 1 baby aspirin daily 81 mg. I will see her again in 6 months to see she's doing. She understands that she will still require perioperative anticoagulation for any future surgeries but this has now become routine for all patients.  #  2. History of Graves' disease treated as outlined above. She remains on thyroid replacement.  #3. History of PTU-induced hepatitis  #4. Microcytosis without anemia   CC:. Dr. Herb Grays; Dr. Carman Ching; Dr. Manus Rudd   [name], MD 9/6/20132:45 PM

## 2012-01-04 NOTE — Progress Notes (Signed)
INR therapeutic today (2.2) on 7.5mg  daily except 10mg  on MWF. No changes in meds or diet.  No problems with bleeding or bruising. Will continue current dose, and recheck INR in 4 weeks at [facility].

## 2012-01-08 ENCOUNTER — Telehealth: Payer: Self-pay | Admitting: Oncology

## 2012-01-08 NOTE — Telephone Encounter (Signed)
Called patient , left message regarding appt on March 2014 MD only

## 2012-01-14 ENCOUNTER — Other Ambulatory Visit: Payer: 59 | Admitting: Lab

## 2012-02-01 ENCOUNTER — Other Ambulatory Visit: Payer: 59 | Admitting: Lab

## 2012-02-26 ENCOUNTER — Emergency Department (HOSPITAL_COMMUNITY)
Admission: EM | Admit: 2012-02-26 | Discharge: 2012-02-26 | Disposition: A | Discharge status: Home / Self Care | Payer: 59 | Attending: Emergency Medicine | Admitting: Emergency Medicine

## 2012-02-26 ENCOUNTER — Emergency Department (HOSPITAL_COMMUNITY): Payer: 59

## 2012-02-26 DIAGNOSIS — Z79899 Other long term (current) drug therapy: Secondary | ICD-10-CM | POA: Insufficient documentation

## 2012-02-26 DIAGNOSIS — Z7982 Long term (current) use of aspirin: Secondary | ICD-10-CM | POA: Insufficient documentation

## 2012-02-26 DIAGNOSIS — F411 Generalized anxiety disorder: Secondary | ICD-10-CM | POA: Insufficient documentation

## 2012-02-26 DIAGNOSIS — M549 Dorsalgia, unspecified: Secondary | ICD-10-CM | POA: Insufficient documentation

## 2012-02-26 DIAGNOSIS — E039 Hypothyroidism, unspecified: Secondary | ICD-10-CM | POA: Insufficient documentation

## 2012-02-26 DIAGNOSIS — Z86711 Personal history of pulmonary embolism: Secondary | ICD-10-CM | POA: Insufficient documentation

## 2012-02-26 DIAGNOSIS — Z8719 Personal history of other diseases of the digestive system: Secondary | ICD-10-CM | POA: Insufficient documentation

## 2012-02-26 DIAGNOSIS — E785 Hyperlipidemia, unspecified: Secondary | ICD-10-CM | POA: Insufficient documentation

## 2012-02-26 DIAGNOSIS — R002 Palpitations: Secondary | ICD-10-CM | POA: Insufficient documentation

## 2012-02-26 DIAGNOSIS — F419 Anxiety disorder, unspecified: Secondary | ICD-10-CM

## 2012-02-26 DIAGNOSIS — Z862 Personal history of diseases of the blood and blood-forming organs and certain disorders involving the immune mechanism: Secondary | ICD-10-CM | POA: Insufficient documentation

## 2012-02-26 LAB — BASIC METABOLIC PANEL
Chloride: 99 mEq/L (ref 96–112)
GFR calc non Af Amer: >90 mL/min (ref >90)
Glucose, Bld: 83 mg/dL (ref 70–99)
Potassium: 3.5 mEq/L (ref 3.5–5.1)
Sodium: 133 mEq/L — ABNORMAL LOW (ref 135–145)

## 2012-02-26 LAB — CBC
Hemoglobin: 11.7 g/dL — ABNORMAL LOW (ref 12.0–15.0)
MCHC: 32.2 g/dL (ref 30.0–36.0)
RBC: 4.84 MIL/uL (ref 3.87–5.11)
WBC: 6.3 10*3/uL (ref 4.0–10.5)

## 2012-02-26 LAB — POCT I-STAT TROPONIN I: Troponin i, poc: 0 ng/mL (ref 0.00–0.08)

## 2012-02-26 MED ORDER — KETOROLAC TROMETHAMINE 30 MG/ML IJ SOLN
30.0000 mg | Freq: Once | INTRAMUSCULAR | Status: AC
  Administered 2012-02-26: 30 mg via INTRAMUSCULAR
  Filled 2012-02-26: qty 1

## 2012-02-26 NOTE — ED Notes (Signed)
Pt states she is here from the clinic in [place], and states that she was there being seen for back pain that would "take her breath away" with turning. Pt states she was brought here because the clinic saw something abnormal on her EKG. Pt states she was just taken off of coumadin 2 months ago for a PE she had in 2003.

## 2012-02-26 NOTE — ED Provider Notes (Signed)
Medical screening examination/treatment/procedure(s) were conducted as a shared visit with non-physician practitioner(s) and myself.  I personally evaluated the patient during the encounter   Lyanne Co, MD 02/26/12 2207

## 2012-02-26 NOTE — ED Notes (Signed)
From [facility] in [place]. Went to the office reporting sharp back pain, posterior lumbar pain since 1730 yesterday. Was at home did a turn and felt pain. CXR done normal and clear, 12 lead EKG normal done at the clinic.  20g SL LAC started via EMS. Hx of PE 10 years.  Aspirin given in the clinic. VS 127/92,P-108 per EMS.

## 2012-02-26 NOTE — ED Provider Notes (Signed)
History   This chart was scribed for non-physician practitioner working with Lyanne Co, MD by Greer Ee. This patient was seen in room WA02/WA02 and the patient's care was started at 19:54.    CSN: [id number]  Arrival date & time 02/26/12  1901   First MD Initiated Contact with Patient 02/26/12 1953      Chief Complaint  Patient presents with  . Back Pain    (Consider location/radiation/quality/duration/timing/severity/associated sxs/prior treatment) The history is provided by the patient. No language interpreter was used.   Lindsey Boone is a 41 y.o. female who presents to the Emergency Department complaining of sharp upper back pain starting at center of back and radiating out bilaterally to edges of back that had sudden onset 24 hours ago when pt made slight twisting motion while performing normal activities at home. Describes pain as sharp, rated 6/10 not relieved by Tylenol. No pain without movement. She feels as if her heart is beating out of her chest.  Pt denies dyspnea, nausea, emesis, rash, any past injuries to back, tingling or numbness in extremities, chest pain, neck stiffness, blurred vision, dizziness, and light-headedness as associated symptoms.  Pt has h/o clotting disorder.  Pt denies tobacco and alcohol use.    Past Medical History  Diagnosis Date  . Migraine   . Hypothyroid   . Iron deficiency anemia, unspecified   . Hyperlipemia   . Edema   . Clotting disorder   . Abdominal wall hernia   . Leg swelling   . Abdominal distension   . Nausea   . Shortness of breath     with exertion   . H/O hiatal hernia     at birth  repaired as a baby.   Marland Kitchen Hx of pulmonary embolus 10/03/2001    Past Surgical History  Procedure Date  . Umbilical hernia repair     w/o obst or gangrene  . Cesarean section 08/20/96  . Cholecystectomy 09/2001  . Ventral hernia repair 08/28/2011    Procedure: HERNIA REPAIR VENTRAL ADULT;  Surgeon: [name]. [name], MD;  Location: [facility] OR;   Service: General;  Laterality: N/A;  Open Ventral Hernia Repair with Mesh  . Hernia repair 08/28/2011    No family history on file.  History  Substance Use Topics  . Smoking status: Never Smoker   . Smokeless tobacco: Never Used  . Alcohol Use: No    No OB history provided.  Review of Systems  HENT: Negative for neck stiffness.   Eyes: Negative for visual disturbance.  Respiratory: Negative for shortness of breath.   Gastrointestinal: Negative for nausea.  Neurological: Negative for dizziness and light-headedness.  All other systems reviewed and are negative.    Allergies  Sulfa antibiotics  Home Medications   Current Outpatient Rx  Name Route Sig Dispense Refill  . ASPIRIN 81 MG PO TABS Oral Take 81 mg by mouth daily.    . OMEGA-3 FATTY ACIDS 1000 MG PO CAPS Oral Take 4 g by mouth daily.    Marland Kitchen METRONIDAZOLE 0.75 % EX CREA Topical Apply 1 application topically daily.     Marland Kitchen SYNTHROID 150 MCG PO TABS Oral Take 150 mcg by mouth daily.       BP 135/92  Pulse 97  Temp 99.2 F (37.3 C) (Oral)  Resp 17  SpO2 94%  Physical Exam  Nursing note and vitals reviewed. Constitutional: She is oriented to person, place, and time. She appears well-developed and well-nourished.  HENT:  Head:  Normocephalic and atraumatic.  Eyes: EOM are normal.  Neck: Normal range of motion. No tracheal deviation present.  Cardiovascular: Regular rhythm and normal heart sounds.   No murmur heard.      Tachycardic.  Distal pulses strong.  Pulmonary/Chest: Effort normal and breath sounds normal. She has no wheezes.  Musculoskeletal: Normal range of motion.       Bilateral thoracic paraspinal muscles.  Full ROM in bilateral shoulders with no pain.  Neurological: She is alert and oriented to person, place, and time.  Skin: Skin is warm.  Psychiatric: She has a normal mood and affect.    ED Course  Procedures (including critical care time)  Date: 02/26/2012  Rate: 99  Rhythm: sinus  tachycardia  QRS Axis: normal  Intervals: normal  ST/T Wave abnormalities: normal  Conduction Disutrbances:none  Narrative Interpretation: no stemi  Old EKG Reviewed: unchanged   DIAGNOSTIC STUDIES: Oxygen Saturation is 94% on room air, adequate by my interpretation.    COORDINATION OF CARE: 20:00- Patient informed of clinical course, understands medical decision-making process, and agrees with plan.  IM toradol, CBC, b-met, d-dimer, and chest XR.      Labs Reviewed  CBC - Abnormal; Notable for the following:    Hemoglobin 11.7 (*)     MCV 75.0 (*)     MCH 24.2 (*)     RDW 15.6 (*)     All other components within normal limits  BASIC METABOLIC PANEL - Abnormal; Notable for the following:    Sodium 133 (*)     All other components within normal limits  D-DIMER, QUANTITATIVE  POCT I-STAT TROPONIN I   Dg Chest 2 View  02/26/2012  *RADIOLOGY REPORT*  Clinical Data: Back pain.  CHEST - 2 VIEW  Comparison: Two-view chest 08/22/2011.  Findings: The heart size is normal.  The lungs are clear.  The visualized soft tissues and bony thorax are unremarkable.  IMPRESSION: Negative chest.   Original Report Authenticated By: Jamesetta Orleans. MATTERN, M.D.      1. Back pain   2. Palpitations   3. Anxiety       MDM  Troponin, d-dimer, EKG and chest x-ray without any concern of cardiac or pulmonary issue. D-dimer obtained due to history of PE. No spinal bony tenderness on exam. On reexamination after giving Toradol, patient states her pain with movement is down to a 2/10. I explained to her in detail chest wall pain. She also admits to increased stress and anxiety at work and feels as if she's having anxiety and panic. She forgot to discuss this with Dr. Collins Scotland. She states she will discuss this with Dr. Collins Scotland on her next visit. She is in no apparent distress and stable for discharge. I advised her to take ibuprofen every 6-8 hours. Return precautions discussed. Case discussed with Dr. Patria Mane  who agrees with plan of care.  I personally performed the services described in this documentation, which was scribed in my presence. The recorded information has been reviewed and considered. Lyanne Co, MD          Trevor Mace, PA-C 02/26/12 2153

## 2012-06-19 ENCOUNTER — Telehealth: Payer: Self-pay | Admitting: Oncology

## 2012-07-11 ENCOUNTER — Ambulatory Visit: Payer: 59 | Admitting: Oncology

## 2012-07-18 ENCOUNTER — Telehealth: Payer: Self-pay | Admitting: Oncology

## 2012-07-18 ENCOUNTER — Ambulatory Visit (HOSPITAL_BASED_OUTPATIENT_CLINIC_OR_DEPARTMENT_OTHER): Payer: 59 | Admitting: Oncology

## 2012-07-18 VITALS — BP 140/97 | HR 108 | Temp 98.2°F | Resp 20 | Ht 64.0 in | Wt 229.3 lb

## 2012-07-18 DIAGNOSIS — R718 Other abnormality of red blood cells: Secondary | ICD-10-CM

## 2012-07-18 DIAGNOSIS — Z87898 Personal history of other specified conditions: Secondary | ICD-10-CM

## 2012-07-18 DIAGNOSIS — Z86711 Personal history of pulmonary embolism: Secondary | ICD-10-CM

## 2012-07-18 NOTE — Telephone Encounter (Signed)
Gave pt appt for lab and MD on March 2015 °

## 2012-07-19 NOTE — Progress Notes (Signed)
Hematology and Oncology Follow Up Visit  Lindsey Boone [id number] [birth year] 42 y.o. 07/19/2012 12:28 PM   Principle Diagnosis: Encounter Diagnoses  Name Primary?  Marland Kitchen Hx of pulmonary embolus Yes  . Hx of pulmonary embolus      Interim History:   Followup visit this pleasant 42 year old woman with a history of autoimmune thyroiditis who subsequently developed unprovoked pulmonary emboli in June of 2003 at about 3 weeks after gallbladder surgery while she was under active evaluation for the thyroid disease. She was found to have Graves' disease and was treated with PTU and subsequently radioactive iodine. A hypercoagulation evaluation was unremarkable. There was a negative family history of clotting. I treated her for 10 years with full dose Coumadin anticoagulation and decided to stop all anticoagulation at time of her visit here in September 2013 and just put her on one aspirin daily. She was a little bit apprehensive about this but now feels great that she doesn't have to come in for monthly lab checks. She's had no interim problems. She denies any dyspnea, chest pain, palpitations, leg swelling or pain.  Medications: reviewed  Allergies:  Allergies  Allergen Reactions  . Sulfa Antibiotics Hives    Upper torso only.    Review of Systems: Constitutional:   No constitutional symptoms Respiratory: See above Cardiovascular: See above  Gastrointestinal: No change in bowel habit Genito-Urinary: Not questioned Musculoskeletal: No muscle bone or joint pain Neurologic: No headache or change in vision Skin: No rash Remaining ROS negative.  Physical Exam: Blood pressure 140/97, pulse 108, temperature 98.2 F (36.8 C), temperature source Oral, resp. rate 20, height 5\' 4"  (1.626 m), weight 229 lb 4.8 oz (104.01 kg), SpO2 98.00%. Wt Readings from Last 3 Encounters:  07/18/12 229 lb 4.8 oz (104.01 kg)  01/04/12 228 lb 4.8 oz (103.556 kg)  10/11/11 226 lb 12.8 oz (102.876 kg)      General appearance: Well-nourished Caucasian woman HENNT: Pharynx no erythema or exudate Lymph nodes: No adenopathy Breasts: Lungs: Clear to auscultation resonant to percussion Heart: Regular rhythm no murmur Abdomen: Soft, nontender, no mass, no organomegaly Extremities: No edema, no calf tenderness Vascular: No cyanosis Neurologic: No focal deficit Skin: No rash  Lab Results: Lab Results  Component Value Date   WBC 6.3 02/26/2012   HGB 11.7* 02/26/2012   HCT 36.3 02/26/2012   MCV 75.0* 02/26/2012   PLT 247 02/26/2012     Chemistry      Component Value Date/Time   NA 133* 02/26/2012 2030   K 3.5 02/26/2012 2030   CL 99 02/26/2012 2030   CO2 22 02/26/2012 2030   BUN 8 02/26/2012 2030   CREATININE 0.57 02/26/2012 2030      Component Value Date/Time   CALCIUM 9.3 02/26/2012 2030   ALKPHOS 72 08/28/2011 0607   AST 16 08/28/2011 0607   ALT 19 08/28/2011 0607   BILITOT 1.2 08/28/2011 0607       Impression and Plan: #1. History of pulmonary embolus 10 years ago likely related to active autoimmune thyroiditis and surgery. She remained stable off anticoagulation now for 6 months. Plan: Continue one aspirin daily. I'll see her again in one year.  #2. History of Graves' disease treated as outlined above  #3. History of PTU-induced hepatitis  #4. Chronic microcytosis without anemia. Iron studies were low in July 2012. Hemoglobin is stable at 12 g but she would probably benefit by iron supplements.  Dr. Herb Grays, Dr. Carman Ching    [name], MD  3/22/201412:28 PM

## 2013-06-01 ENCOUNTER — Telehealth: Payer: Self-pay | Admitting: *Deleted

## 2013-06-01 NOTE — Telephone Encounter (Signed)
Lm informed the pt that G would like for her to come in early on 07/15/13 . gv appt for 07/15/13 w/ labs@ 11:30am and ov@ 12 noon. Made the pt aware that i will mail a letter/avs...td

## 2013-06-29 ENCOUNTER — Encounter: Payer: Self-pay | Admitting: Oncology

## 2013-07-05 IMAGING — CT CT ANGIO CHEST
2 of 6 series · 19 of 46 positions shown · IV contrast (APPLIED)
Comparison: CT chest 10/28/2005.

CLINICAL DATA: Chest pain.

CT ANGIOGRAPHY CHEST WITH CONTRAST
TECHNIQUE: Multidetector CT imaging of the chest was performed
using the standard protocol during bolus administration of
intravenous contrast.  Multiplanar CT image reconstructions
including MIPs were obtained to evaluate the vascular anatomy.
Contrast:  100 ml [id number]

[Series 7: pe thins @ 1mm · axial · 0.65mm/px · z∈[+46,+268]mm · 16 of 244 slices shown]
[im 11/244  lung]
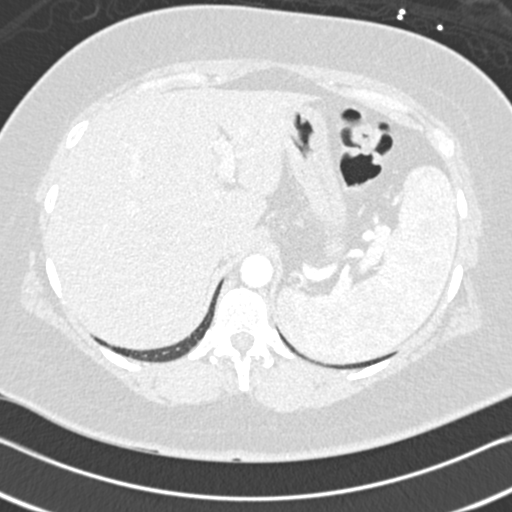
[im 32/244  soft-tissue]
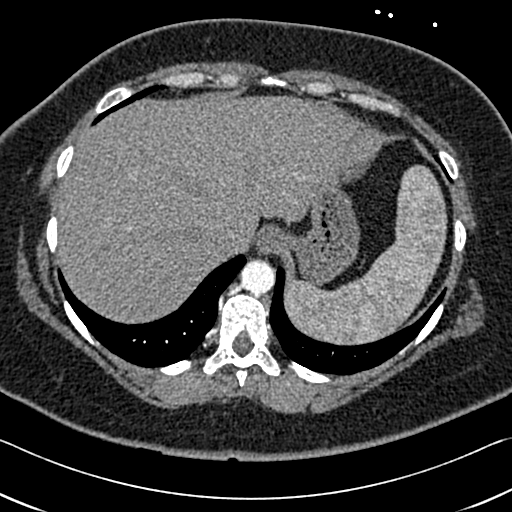
[im 43/244  lung]
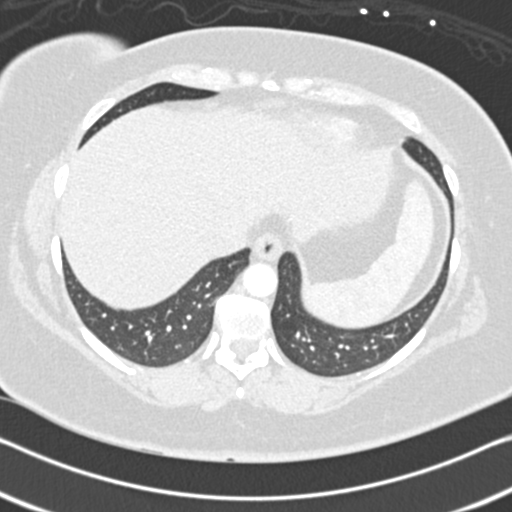
[im 53/244  soft-tissue]
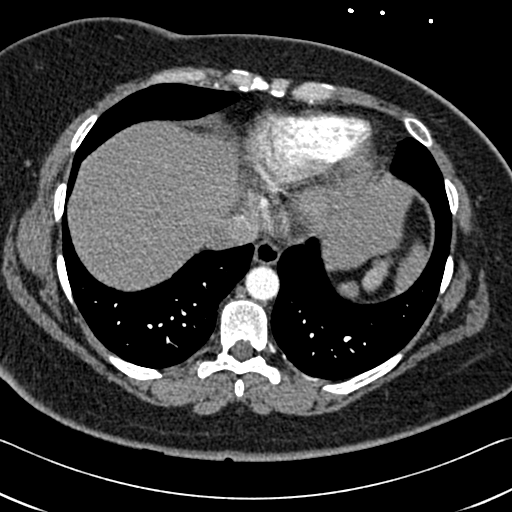
[im 74/244  lung]
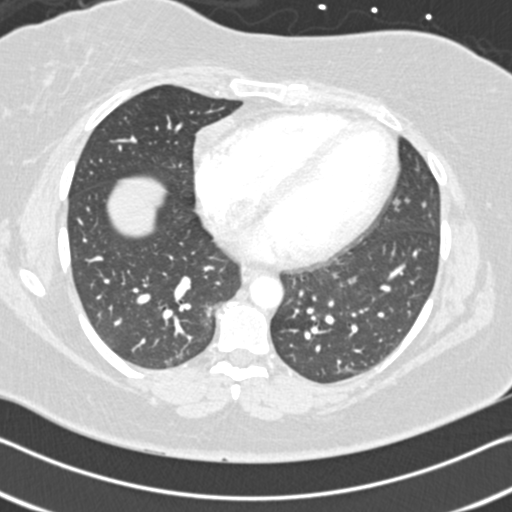
[im 85/244  soft-tissue]
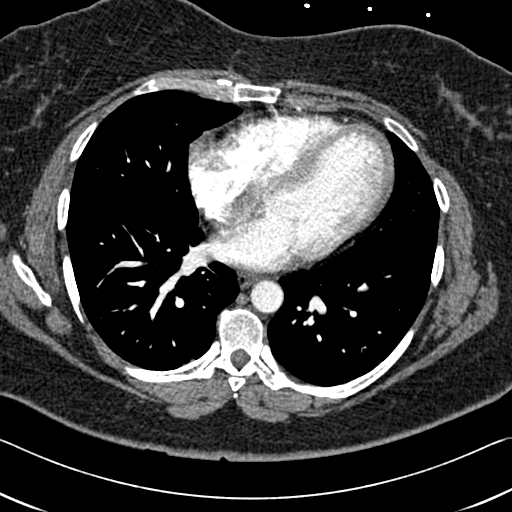
[im 96/244  lung]
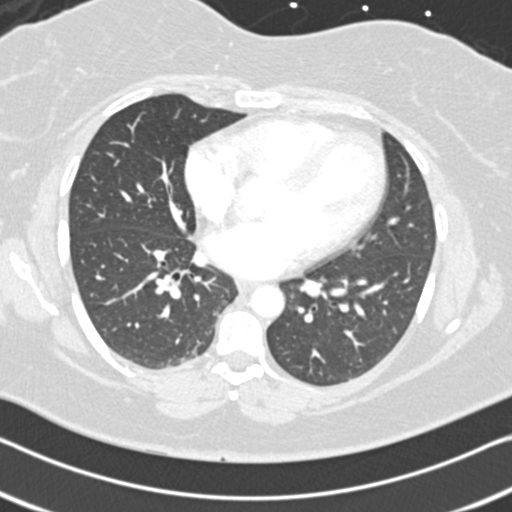
[im 117/244  soft-tissue]
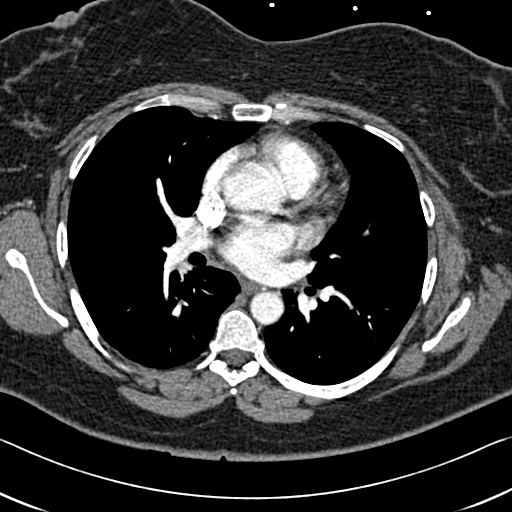
[im 127/244  lung]
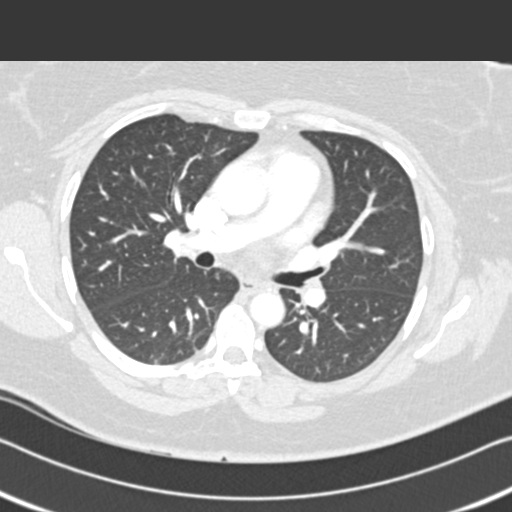
[im 148/244  soft-tissue]
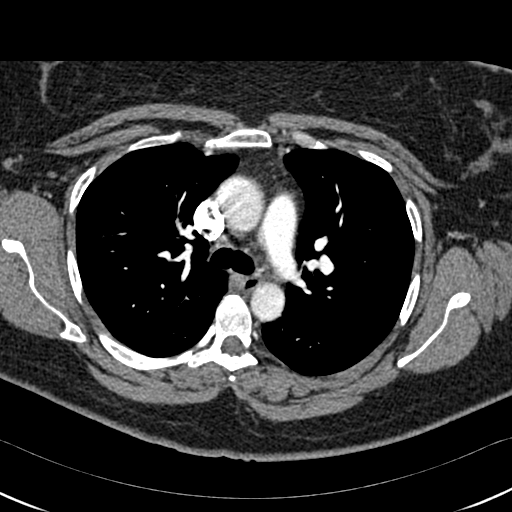
[im 159/244  lung]
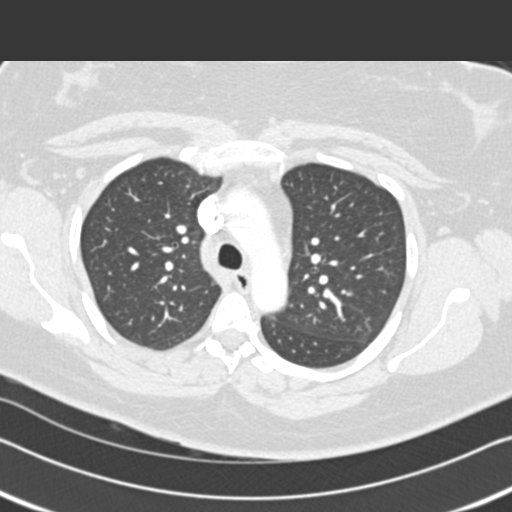
[im 170/244  soft-tissue]
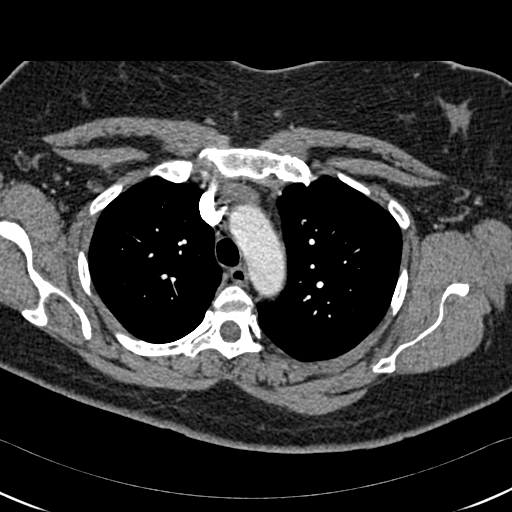
[im 191/244  lung]
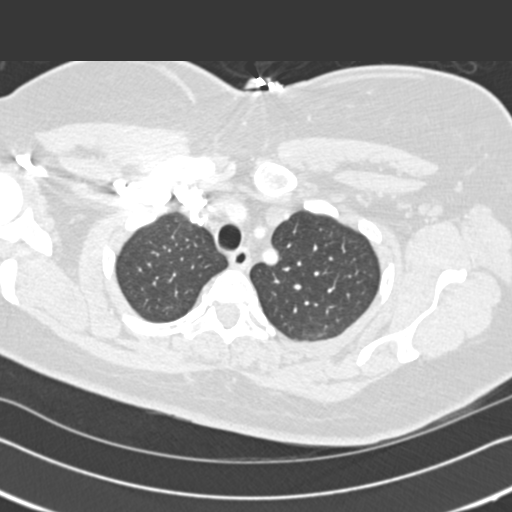
[im 201/244  soft-tissue]
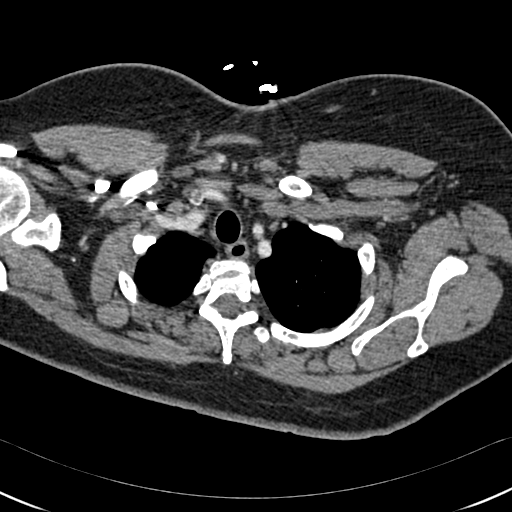
[im 212/244  lung]
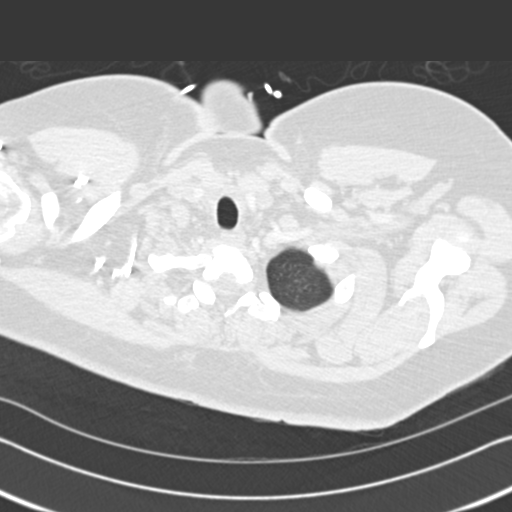
[im 233/244  soft-tissue]
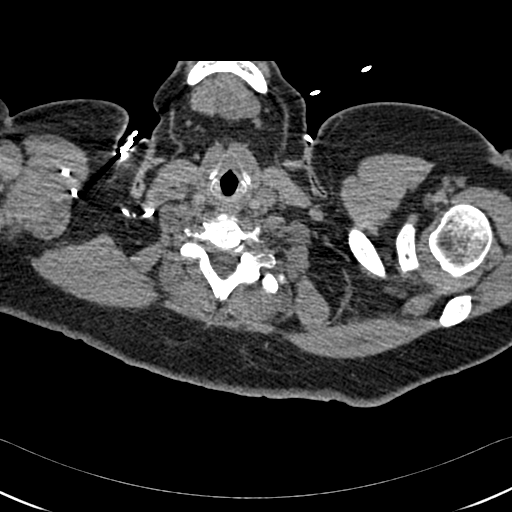

[Series 605: <mpr thick range> · coronal · 0.65mm/px · 3 of 103 slices shown]
[im 26/103  soft-tissue]
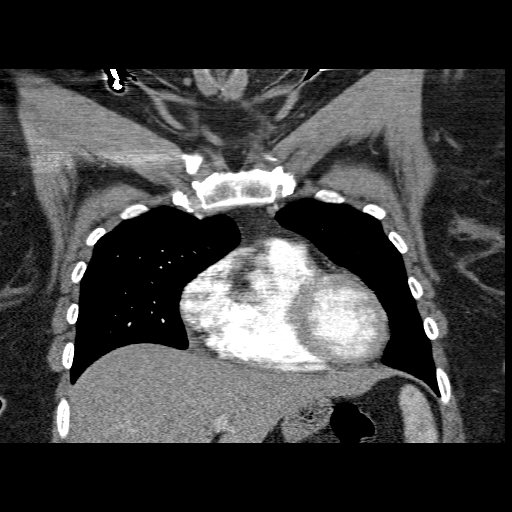
[im 52/103  soft-tissue]
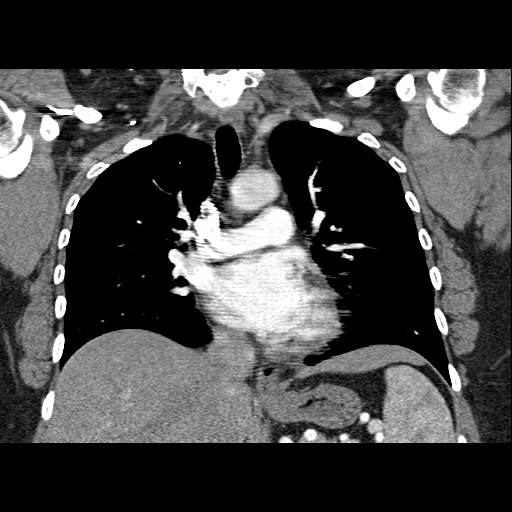
[im 77/103  soft-tissue]
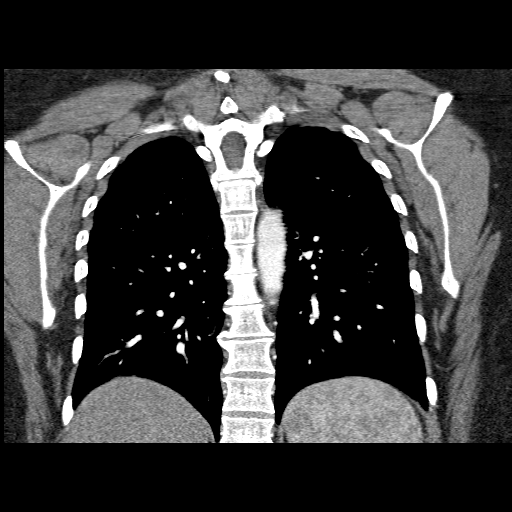

[19 of 46 positions shown; findings below may reference images not displayed]

FINDINGS: The chest wall is unremarkable.  No obvious breast
masses, supraclavicular or axillary lymphadenopathy.  The bony
thorax is intact.

The heart is normal in size.  No pericardial effusion.  No
mediastinal or hilar lymphadenopathy.  The aorta is normal in
caliber.  No dissection.  The esophagus is grossly normal.

The pulmonary arterial tree is fairly well opacified.  No filling
defects to suggest pulmonary emboli.

Examination of the lung parenchyma demonstrates no acute pulmonary
findings.  No pleural effusion.  No pulmonary masses.

The upper abdomen is unremarkable.

Review of the MIP images confirms the above findings.
IMPRESSION: 1.  No CT findings for pulmonary embolism.
2.  Normal thoracic aorta.
3.  No acute pulmonary findings.

## 2013-07-15 ENCOUNTER — Ambulatory Visit (HOSPITAL_BASED_OUTPATIENT_CLINIC_OR_DEPARTMENT_OTHER): Payer: BC Managed Care – PPO | Admitting: Oncology

## 2013-07-15 ENCOUNTER — Other Ambulatory Visit (HOSPITAL_BASED_OUTPATIENT_CLINIC_OR_DEPARTMENT_OTHER): Payer: BC Managed Care – PPO

## 2013-07-15 VITALS — BP 150/93 | HR 86 | Temp 98.5°F | Resp 20 | Ht 64.0 in | Wt 230.1 lb

## 2013-07-15 DIAGNOSIS — Z86711 Personal history of pulmonary embolism: Secondary | ICD-10-CM

## 2013-07-15 DIAGNOSIS — E039 Hypothyroidism, unspecified: Secondary | ICD-10-CM

## 2013-07-15 DIAGNOSIS — R718 Other abnormality of red blood cells: Secondary | ICD-10-CM

## 2013-07-15 LAB — CBC WITH DIFFERENTIAL/PLATELET
BASO%: 0.9 % (ref 0.0–2.0)
Basophils Absolute: 0.1 10*3/uL (ref 0.0–0.1)
EOS ABS: 0.2 10*3/uL (ref 0.0–0.5)
EOS%: 2.3 % (ref 0.0–7.0)
HEMATOCRIT: 36.4 % (ref 34.8–46.6)
HGB: 11.8 g/dL (ref 11.6–15.9)
LYMPH%: 27.3 % (ref 14.0–49.7)
MCH: 24.3 pg — ABNORMAL LOW (ref 25.1–34.0)
MCHC: 32.4 g/dL (ref 31.5–36.0)
MCV: 75.1 fL — AB (ref 79.5–101.0)
MONO#: 0.7 10*3/uL (ref 0.1–0.9)
MONO%: 11.2 % (ref 0.0–14.0)
NEUT%: 58.3 % (ref 38.4–76.8)
NEUTROS ABS: 3.8 10*3/uL (ref 1.5–6.5)
NRBC: 0 % (ref 0–0)
PLATELETS: 261 10*3/uL (ref 145–400)
RBC: 4.85 10*6/uL (ref 3.70–5.45)
RDW: 15 % — ABNORMAL HIGH (ref 11.2–14.5)
WBC: 6.5 10*3/uL (ref 3.9–10.3)
lymph#: 1.8 10*3/uL (ref 0.9–3.3)

## 2013-07-15 NOTE — Progress Notes (Signed)
Hematology and Oncology Follow Up Visit  Lindsey Boone [id number] 07-26-1970 43 y.o. 07/15/2013 1:50 PM   Principle Diagnosis: Encounter Diagnoses  Name Primary?  . Hypothyroid Yes  . Hx of pulmonary embolus      Interim History:  Followup visit this pleasant 43 year old woman with a history of autoimmune thyroiditis who subsequently developed unprovoked pulmonary emboli in June of 2003 at about 3 weeks after gallbladder surgery while she was under active evaluation for the thyroid disease. She was found to have Graves' disease and was treated with PTU and subsequently radioactive iodine. A hypercoagulation evaluation was unremarkable. There was a negative family history of clotting. I treated her for 10 years with full dose Coumadin anticoagulation and decided to stop all anticoagulation at time of her visit here in September 2013 and just put her on one aspirin daily.  She has had no subsequent thrombotic events off Coumadin now for almost 2 years.    She's had no interim Medicalproblems. She denies any dyspnea, chest pain, palpitations, leg swelling or pain.   Medications: reviewed  Allergies:  Allergies  Allergen Reactions  . Sulfa Antibiotics Hives    Upper torso only.    Review of Systems: Hematology:  No bleeding or bruising ENT ROS: no sore throat Breast ROS:  Respiratory ROS: no cough or dyspnea Cardiovascular ROS:  No chest pain or palpitations Gastrointestinal ROS:  No abdominal pain no change in bowel habit  Genito-Urinary ROS:no urinary tract symptoms.  Musculoskeletal ROS: no muscle or bone pain Neurological ROS:no headache or change in vision  Dermatological ROS: no rash Remaining ROS negative:   Physical Exam: Blood pressure 150/93, pulse 86, temperature 98.5 F (36.9 C), temperature source Oral, resp. rate 20, height 5\' 4"  (1.626 m), weight 230 lb 1.6 oz (104.373 kg), SpO2 100.00%. Wt Readings from Last 3 Encounters:  07/15/13 230 lb 1.6 oz (104.373 kg)   07/18/12 229 lb 4.8 oz (104.01 kg)  01/04/12 228 lb 4.8 oz (103.556 kg)     General appearance: well-nourished Caucasian woman HENNT: Pharynx no erythema, exudate, mass, or ulcer. No thyromegaly or thyroid nodules Lymph nodes: No cervical, supraclavicular, or axillary lymphadenopathy Breasts: Lungs: Clear to auscultation, resonant to percussion throughout Heart: Regular rhythm, no murmur, no gallop, no rub, no click, no edema Abdomen: Soft, nontender, normal bowel sounds, no mass, no organomegaly Extremities: No edema, no calf tenderness Musculoskeletal: no joint deformities GU:  Vascular: Carotid pulses 2+, no bruits,  Neurologic: Alert, oriented, PERRLA, exudate, cranial nerves grossly normal, motor strength 5 over 5, reflexes 1+ symmetric, upper body coordination normal, gait normal, Skin: No rash or ecchymosis  Lab Results: CBC W/Diff    Component Value Date/Time   WBC 6.5 07/15/2013 1158   WBC 6.3 02/26/2012 2030   RBC 4.85 07/15/2013 1158   RBC 4.84 02/26/2012 2030   HGB 11.8 07/15/2013 1158   HGB 11.7* 02/26/2012 2030   HCT 36.4 07/15/2013 1158   HCT 36.3 02/26/2012 2030   PLT 261 07/15/2013 1158   PLT 247 02/26/2012 2030   MCV 75.1* 07/15/2013 1158   MCV 75.0* 02/26/2012 2030   MCH 24.3* 07/15/2013 1158   MCH 24.2* 02/26/2012 2030   MCHC 32.4 07/15/2013 1158   MCHC 32.2 02/26/2012 2030   RDW 15.0* 07/15/2013 1158   RDW 15.6* 02/26/2012 2030   LYMPHSABS 1.8 07/15/2013 1158   LYMPHSABS 2.6 12/06/2010 0224   MONOABS 0.7 07/15/2013 1158   MONOABS 0.6 12/06/2010 0224   EOSABS 0.2 07/15/2013 1158  EOSABS 0.5 12/06/2010 0224   BASOSABS 0.1 07/15/2013 1158   BASOSABS 0.1 12/06/2010 0224     Chemistry      Component Value Date/Time   NA 133* 02/26/2012 2030   K 3.5 02/26/2012 2030   CL 99 02/26/2012 2030   CO2 22 02/26/2012 2030   BUN 8 02/26/2012 2030   CREATININE 0.57 02/26/2012 2030      Component Value Date/Time   CALCIUM 9.3 02/26/2012 2030   ALKPHOS 72 08/28/2011 0607    AST 16 08/28/2011 0607   ALT 19 08/28/2011 0607   BILITOT 1.2 08/28/2011 0607        Impression:  #1. History of pulmonary emboli likely related to transient risk factors of surgery and autoimmune thyroiditis. She'll continue low-dose aspirin indefinitely. Call me if she has to have any surgery so we can manage prophylacticanticoagulation perioperatively.  #2.treated Graves' disease now hypothyroid on replacement  #3. History of PTU-induced hepatitis  #4. Chronic microcytosis withoutsignificant anemia.(hemoglobin stable at 12 g for many years) We discussed iron replacement. I told her to start an over the counter preparation and take it twice daily.   CC: Patient Care Team: Herb Grays, MD as PCP - General (Family Medicine)   [name], MD 3/18/20151:51 PM

## 2013-07-17 ENCOUNTER — Other Ambulatory Visit: Payer: 59

## 2013-07-17 ENCOUNTER — Ambulatory Visit: Payer: 59 | Admitting: Oncology

## 2014-06-24 ENCOUNTER — Telehealth: Payer: Self-pay | Admitting: Oncology

## 2014-06-24 NOTE — Telephone Encounter (Signed)
Call to patient to confirm appointment for 06/28/14 at 8:30. lmtcb

## 2014-06-28 ENCOUNTER — Other Ambulatory Visit (INDEPENDENT_AMBULATORY_CARE_PROVIDER_SITE_OTHER): Payer: BLUE CROSS/BLUE SHIELD

## 2014-06-28 ENCOUNTER — Other Ambulatory Visit: Payer: Self-pay | Admitting: Oncology

## 2014-06-28 DIAGNOSIS — D509 Iron deficiency anemia, unspecified: Secondary | ICD-10-CM

## 2014-06-28 DIAGNOSIS — Z86711 Personal history of pulmonary embolism: Secondary | ICD-10-CM

## 2014-06-28 LAB — CBC WITH DIFFERENTIAL/PLATELET
Basophils Absolute: 0.1 10*3/uL (ref 0.0–0.1)
Basophils Relative: 1 % (ref 0–1)
EOS ABS: 0.2 10*3/uL (ref 0.0–0.7)
EOS PCT: 4 % (ref 0–5)
HEMATOCRIT: 38.5 % (ref 36.0–46.0)
HEMOGLOBIN: 12.3 g/dL (ref 12.0–15.0)
LYMPHS ABS: 1.4 10*3/uL (ref 0.7–4.0)
Lymphocytes Relative: 27 % (ref 12–46)
MCH: 24.3 pg — ABNORMAL LOW (ref 26.0–34.0)
MCHC: 31.9 g/dL (ref 30.0–36.0)
MCV: 75.9 fL — ABNORMAL LOW (ref 78.0–100.0)
MPV: 10.5 fL (ref 8.6–12.4)
Monocytes Absolute: 0.4 10*3/uL (ref 0.1–1.0)
Monocytes Relative: 7 % (ref 3–12)
Neutro Abs: 3.2 10*3/uL (ref 1.7–7.7)
Neutrophils Relative %: 61 % (ref 43–77)
Platelets: 282 10*3/uL (ref 150–400)
RBC: 5.07 MIL/uL (ref 3.87–5.11)
RDW: 16.7 % — AB (ref 11.5–15.5)
WBC: 5.2 10*3/uL (ref 4.0–10.5)

## 2014-06-28 LAB — IRON AND TIBC
%SAT: 6 % — AB (ref 20–55)
Iron: 23 ug/dL — ABNORMAL LOW (ref 42–145)
TIBC: 365 ug/dL (ref 250–470)
UIBC: 342 ug/dL (ref 125–400)

## 2014-06-28 LAB — FERRITIN: Ferritin: 6 ng/mL — ABNORMAL LOW (ref 10–291)

## 2014-07-01 ENCOUNTER — Telehealth: Payer: Self-pay | Admitting: Oncology

## 2014-07-01 ENCOUNTER — Other Ambulatory Visit: Payer: Self-pay | Admitting: Oncology

## 2014-07-01 NOTE — Telephone Encounter (Signed)
Call to patient to confirm appointment for 07/05/14 at 8:15 lmtcb

## 2014-07-05 ENCOUNTER — Encounter: Payer: Self-pay | Admitting: Oncology

## 2014-07-05 ENCOUNTER — Ambulatory Visit (INDEPENDENT_AMBULATORY_CARE_PROVIDER_SITE_OTHER): Payer: BLUE CROSS/BLUE SHIELD | Admitting: Oncology

## 2014-07-05 VITALS — BP 142/91 | HR 76 | Temp 98.2°F | Ht 64.0 in | Wt 231.4 lb

## 2014-07-05 DIAGNOSIS — D509 Iron deficiency anemia, unspecified: Secondary | ICD-10-CM

## 2014-07-05 DIAGNOSIS — Z86711 Personal history of pulmonary embolism: Secondary | ICD-10-CM | POA: Diagnosis not present

## 2014-07-05 HISTORY — DX: Iron deficiency anemia, unspecified: D50.9

## 2014-07-05 MED ORDER — POLYSACCHARIDE IRON COMPLEX 150 MG PO CAPS: 150.0000 mg | ORAL_CAPSULE | Freq: Two times a day (BID) | ORAL | Status: DC

## 2014-07-05 NOTE — Patient Instructions (Signed)
Start Niferex iron 150 mg twice daily Return visit 1 year Lab 1 week before visit

## 2014-07-05 NOTE — Progress Notes (Signed)
Patient ID: Lindsey Boone, female   DOB: 12/01/1970, 44 y.o.   MRN: [id number] Hematology and Oncology Follow Up Visit  Lindsey Boone [id number] 1971/03/03 43 y.o. 07/05/2014 8:47 AM   Principle Diagnosis: Encounter Diagnoses  Name Primary?  Marland Kitchen Hx of pulmonary embolus Yes  . Anemia, iron deficiency   Clinical Summary:  44 year old woman with a history of autoimmune thyroiditis who subsequently developed unprovoked pulmonary emboli in June of 2003 at about 3 weeks after gallbladder surgery while she was under active evaluation for the thyroid disease. She was found to have Graves' disease and was treated with PTU and subsequently radioactive iodine. She is now hypothyroid on synthroid replacement.  A hypercoagulation evaluation was unremarkable. There was a negative family history of clotting. I treated her for 10 years with full dose Coumadin anticoagulation and decided to stop all anticoagulation at time of her visit here in September 2013 and just put her on one aspirin daily.  She has had no subsequent thrombotic events off Coumadin now for almost 3 years.  . Interim History:    She has had no interim Medicalproblems. She denies any dyspnea, chest pain, palpitations, leg swelling or pain. She is still having her periods. We discussed going on iron supplementation at time of her visit last year but she never did this. Lab in anticipation of today's visit shows stable hemoglobin of 12 g but further decrease in her serum iron and her ferritin is very low at 6. We discussed iron replacement again today. She has a sulfa allergy. I'm going to put her on a ferrous gluconate preparation Niferex 150 mg twice daily. Once her iron stores are replete, she can decrease to once daily.  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  Medications: reviewed  Allergies:  Allergies  Allergen Reactions  . Sulfa Antibiotics Hives    Upper torso only.    Review of Systems: See history  of present illness Remaining ROS negative:   Physical Exam: Blood pressure 142/91, pulse 76, temperature 98.2 F (36.8 C), temperature source Oral, height  (1.626 m), weight 231 lb 6.4 oz (104.962 kg), SpO2 98 %. Wt Readings from Last 3 Encounters:  07/05/14 231 lb 6.4 oz (104.962 kg)  07/15/13 230 lb 1.6 oz (104.373 kg)  07/18/12 229 lb 4.8 oz (104.01 kg)     General appearance: WDWN caucasian female HENNT: Pharynx no erythema, exudate, mass, or ulcer. No thyromegaly or thyroid nodules Lymph nodes: No cervical, supraclavicular, or axillary lymphadenopathy Breasts:  Lungs: Clear to auscultation, resonant to percussion throughout Heart: Regular rhythm, no murmur, no gallop, no rub, no click, no edema Abdomen: Soft, nontender, normal bowel sounds, no mass, no organomegaly Extremities: No edema, no calf tenderness Musculoskeletal: no joint deformities GU:  Vascular: Carotid pulses 2+, no bruits, distal pulses: Dorsalis pedis not palpable Neurologic: Alert, oriented, PERRLA,  Skin: No rash or ecchymosis  Lab Results: CBC W/Diff    Component Value Date/Time   WBC 5.2 06/28/2014 0836   WBC 6.5 07/15/2013 1158   RBC 5.07 06/28/2014 0836   RBC 4.85 07/15/2013 1158   HGB 12.3 06/28/2014 0836   HGB 11.8 07/15/2013 1158   HCT 38.5 06/28/2014 0836   HCT 36.4 07/15/2013 1158   PLT 282 06/28/2014 0836   PLT 261 07/15/2013 1158   MCV 75.9* 06/28/2014 0836   MCV 75.1* 07/15/2013 1158   MCH 24.3* 06/28/2014 0836   MCH 24.3* 07/15/2013 1158   MCHC 31.9 06/28/2014 0836  MCHC 32.4 07/15/2013 1158   RDW 16.7* 06/28/2014 0836   RDW 15.0* 07/15/2013 1158   LYMPHSABS 1.4 06/28/2014 0836   LYMPHSABS 1.8 07/15/2013 1158   MONOABS 0.4 06/28/2014 0836   MONOABS 0.7 07/15/2013 1158   EOSABS 0.2 06/28/2014 0836   EOSABS 0.2 07/15/2013 1158   BASOSABS 0.1 06/28/2014 0836   BASOSABS 0.1 07/15/2013 1158     Chemistry      Component Value Date/Time   NA 133* 02/26/2012 2030   K 3.5  02/26/2012 2030   CL 99 02/26/2012 2030   CO2 22 02/26/2012 2030   BUN 8 02/26/2012 2030   CREATININE 0.57 02/26/2012 2030      Component Value Date/Time   CALCIUM 9.3 02/26/2012 2030   ALKPHOS 72 08/28/2011 0607   AST 16 08/28/2011 0607   ALT 19 08/28/2011 0607   BILITOT 1.2 08/28/2011 0607     Impression:  #1. History of pulmonary emboli likely related to transient risk factors of surgery and autoimmune thyroiditis. She will  continue low-dose aspirin indefinitely. She will me if she has to have any surgery so we can manage prophylactic anticoagulation perioperatively.  #2.Treated Graves' disease now hypothyroid on replacement  #3. History of PTU-induced hepatitis  #4. Chronic microcytosis withoutsignificant anemia. Begin Niferex iron supplementation as outlined above.   CC: Patient Care Team: Camillo FlamingBeth Barnes, MD as PCP - General (Family Medicine)   Levert FeinsteinGRANFORTUNA,Seward Coran M, MD 3/7/20168:47 AM

## 2014-09-09 ENCOUNTER — Other Ambulatory Visit: Payer: Self-pay | Admitting: Obstetrics and Gynecology

## 2014-09-10 LAB — CYTOLOGY - PAP

## 2014-10-06 ENCOUNTER — Other Ambulatory Visit: Payer: Self-pay | Admitting: Oncology

## 2015-06-21 ENCOUNTER — Encounter: Payer: Self-pay | Admitting: *Deleted

## 2015-07-04 ENCOUNTER — Other Ambulatory Visit (INDEPENDENT_AMBULATORY_CARE_PROVIDER_SITE_OTHER): Payer: BLUE CROSS/BLUE SHIELD

## 2015-07-04 DIAGNOSIS — D509 Iron deficiency anemia, unspecified: Secondary | ICD-10-CM | POA: Diagnosis not present

## 2015-07-04 DIAGNOSIS — Z86711 Personal history of pulmonary embolism: Secondary | ICD-10-CM

## 2015-07-05 LAB — CBC WITH DIFFERENTIAL/PLATELET
BASOS: 1 %
Basophils Absolute: 0.1 10*3/uL (ref 0.0–0.2)
EOS (ABSOLUTE): 0.1 10*3/uL (ref 0.0–0.4)
EOS: 2 %
HEMATOCRIT: 40.8 % (ref 34.0–46.6)
Hemoglobin: 13 g/dL (ref 11.1–15.9)
IMMATURE GRANULOCYTES: 0 %
Immature Grans (Abs): 0 10*3/uL (ref 0.0–0.1)
LYMPHS ABS: 1.7 10*3/uL (ref 0.7–3.1)
Lymphs: 26 %
MCH: 24.6 pg — AB (ref 26.6–33.0)
MCHC: 31.9 g/dL (ref 31.5–35.7)
MCV: 77 fL — AB (ref 79–97)
MONOS ABS: 0.6 10*3/uL (ref 0.1–0.9)
Monocytes: 9 %
NEUTROS ABS: 3.9 10*3/uL (ref 1.4–7.0)
Neutrophils: 62 %
Platelets: 281 10*3/uL (ref 150–379)
RBC: 5.28 x10E6/uL (ref 3.77–5.28)
RDW: 16.5 % — AB (ref 12.3–15.4)
WBC: 6.4 10*3/uL (ref 3.4–10.8)

## 2015-07-05 LAB — FERRITIN: FERRITIN: 12 ng/mL — AB (ref 15–150)

## 2015-07-05 LAB — IRON AND TIBC
IRON: 32 ug/dL (ref 27–159)
Iron Saturation: 10 % — ABNORMAL LOW (ref 15–55)
TIBC: 331 ug/dL (ref 250–450)
UIBC: 299 ug/dL (ref 131–425)

## 2015-07-11 ENCOUNTER — Ambulatory Visit (INDEPENDENT_AMBULATORY_CARE_PROVIDER_SITE_OTHER): Payer: BLUE CROSS/BLUE SHIELD | Admitting: Oncology

## 2015-07-11 ENCOUNTER — Encounter: Payer: Self-pay | Admitting: Oncology

## 2015-07-11 VITALS — BP 157/103 | HR 80 | Temp 98.3°F | Resp 20 | Ht 62.0 in | Wt 242.6 lb

## 2015-07-11 DIAGNOSIS — Z86711 Personal history of pulmonary embolism: Secondary | ICD-10-CM

## 2015-07-11 DIAGNOSIS — D509 Iron deficiency anemia, unspecified: Secondary | ICD-10-CM | POA: Diagnosis not present

## 2015-07-11 DIAGNOSIS — E0500 Thyrotoxicosis with diffuse goiter without thyrotoxic crisis or storm: Secondary | ICD-10-CM | POA: Diagnosis not present

## 2015-07-11 MED ORDER — POLYSACCHARIDE IRON COMPLEX 150 MG PO CAPS: 150.0000 mg | ORAL_CAPSULE | Freq: Every day | ORAL | Status: DC

## 2015-07-11 NOTE — Patient Instructions (Signed)
Lab 07/02/16 MD visit 1-2 weeks after lab

## 2015-07-11 NOTE — Progress Notes (Signed)
Patient ID: Lindsey Boone, female   DOB: 1971/01/09, 45 y.o.   MRN: [id number] Hematology and Oncology Follow Up Visit  LAURIANN MILILLO [id number] 13-Sep-1970 45 y.o. 07/11/2015 8:48 AM   Principle Diagnosis: Encounter Diagnoses  Name Primary?  Marland Kitchen Hx of pulmonary embolus Yes  . Anemia, iron deficiency   Clinical Summary: Pleasant 45 year old woman with a history of autoimmune thyroiditis who subsequently developed provoked pulmonary emboli in June of 2003 at about 3 weeks after gallbladder surgery while she was under active evaluation for the thyroid disease. She was found to have Graves' disease and was treated with PTU and subsequently radioactive iodine. A hypercoagulation evaluation was unremarkable. There was a negative family history of clotting. I treated her for 10 years with full dose Coumadin anticoagulation and decided to stop all anticoagulation at time of her visit here in September 2013 and just put her on one aspirin daily.  She has had no subsequent thrombotic events off Coumadin now for over 3.   Interim History:    She has had no interim medical problems. Recent follow-up with her primary care physician. Blood pressure running high. Hydrochlorothiazide prescribed. She didn't fill the prescription yet. Thyroid medication dose was adjusted down from 150 to  125 g daily.  She is just getting over a viral illness which lasted for about 2 weeks. Significant myalgias and fatigue. She denies any cardiorespiratory symptoms, no dyspnea, chest pain, or palpitations. She has chronic leg swelling. No calf tenderness.   She has not had a menstrual cycle in the last 3 months since December. Her family is doing well. She has 3 children. She continues to work full-time.  Medications: reviewed  Allergies:  Allergies  Allergen Reactions  . Sulfa Antibiotics Hives    Upper torso only.    Review of Systems: See interim history Remaining ROS negative:   Physical Exam: Blood pressure  157/103, pulse 80, temperature 98.3 F (36.8 C), temperature source Oral, resp. rate 20, height  (1.575 m), weight 242 lb 9.6 oz (110.043 kg), last menstrual period 04/04/2015, SpO2 99 %. Wt Readings from Last 3 Encounters:  07/11/15 242 lb 9.6 oz (110.043 kg)  07/05/14 231 lb 6.4 oz (104.962 kg)  07/15/13 230 lb 1.6 oz (104.373 kg)     General appearance: well nourished Caucasian woman HENNT: Pharynx no erythema, exudate, mass, or ulcer. No thyromegaly or thyroid nodules Lymph nodes: No cervical, supraclavicular, or axillary lymphadenopathy Breasts:  Lungs: Clear to auscultation, resonant to percussion throughout Heart: Regular rhythm, no murmur, no gallop, no rub, no click, no edema Abdomen: Soft, nontender, normal bowel sounds, no mass, no organomegaly Extremities: No edema, no calf tenderness Musculoskeletal: no joint deformities GU:  Vascular: Carotid pulses 2+, no bruits,  Neurologic: Alert, oriented, PERRLA,cranial nerves grossly normal, motor strength 5 over 5, reflexes 1+ symmetric, upper body coordination normal, gait normal, Skin: No rash or ecchymosis  Lab Results: CBC W/Diff    Component Value Date/Time   WBC 6.4 07/04/2015 0836   WBC 5.2 06/28/2014 0836   WBC 6.5 07/15/2013 1158   RBC 5.28 07/04/2015 0836   RBC 5.07 06/28/2014 0836   RBC 4.85 07/15/2013 1158   HGB 12.3 06/28/2014 0836   HGB 11.8 07/15/2013 1158   HCT 40.8 07/04/2015 0836   HCT 38.5 06/28/2014 0836   HCT 36.4 07/15/2013 1158   PLT 281 07/04/2015 0836   PLT 282 06/28/2014 0836   PLT 261 07/15/2013 1158   MCV 77* 07/04/2015 6962  MCV 75.9* 06/28/2014 0836   MCV 75.1* 07/15/2013 1158   MCH 24.6* 07/04/2015 0836   MCH 24.3* 06/28/2014 0836   MCH 24.3* 07/15/2013 1158   MCHC 31.9 07/04/2015 0836   MCHC 31.9 06/28/2014 0836   MCHC 32.4 07/15/2013 1158   RDW 16.5* 07/04/2015 0836   RDW 16.7* 06/28/2014 0836   RDW 15.0* 07/15/2013 1158   LYMPHSABS 1.7 07/04/2015 0836   LYMPHSABS 1.4  06/28/2014 0836   LYMPHSABS 1.8 07/15/2013 1158   MONOABS 0.4 06/28/2014 0836   MONOABS 0.7 07/15/2013 1158   EOSABS 0.1 07/04/2015 0836   EOSABS 0.2 06/28/2014 0836   EOSABS 0.2 07/15/2013 1158   BASOSABS 0.1 07/04/2015 0836   BASOSABS 0.1 06/28/2014 0836   BASOSABS 0.1 07/15/2013 1158     Chemistry      Component Value Date/Time   NA 133* 02/26/2012 2030   K 3.5 02/26/2012 2030   CL 99 02/26/2012 2030   CO2 22 02/26/2012 2030   BUN 8 02/26/2012 2030   CREATININE 0.57 02/26/2012 2030      Component Value Date/Time   CALCIUM 9.3 02/26/2012 2030   ALKPHOS 72 08/28/2011 0607   AST 16 08/28/2011 0607   ALT 19 08/28/2011 0607   BILITOT 1.2 08/28/2011 0607     ferritin 12 , iron 32, TIBC 331   Radiological Studies: No results found.  Impression:  #1. History of pulmonary emboli likely related to transient risk factors of surgery and autoimmune thyroiditis. She'll continue low-dose aspirin indefinitely.    #2.treated Graves' disease now hypothyroid on replacement  #3. History of PTU-induced hepatitis  #4. 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 Despite low storage iron she is able to maintain a normal hemoglobin. She will continue Niferex but I will decrease the dose to once daily.  CC: Patient Care Team: Juluis RainierElizabeth Barnes, MD as PCP - General (Family Medicine)   Levert FeinsteinJAMES M Brees Hounshell, MD 3/13/20178:48 AM

## 2015-07-14 ENCOUNTER — Other Ambulatory Visit: Payer: BLUE CROSS/BLUE SHIELD

## 2015-07-25 ENCOUNTER — Ambulatory Visit: Payer: BLUE CROSS/BLUE SHIELD | Admitting: Oncology

## 2015-09-20 ENCOUNTER — Other Ambulatory Visit: Payer: Self-pay | Admitting: Obstetrics and Gynecology

## 2015-09-20 DIAGNOSIS — R928 Other abnormal and inconclusive findings on diagnostic imaging of breast: Secondary | ICD-10-CM

## 2015-10-06 ENCOUNTER — Ambulatory Visit
Admission: RE | Admit: 2015-10-06 | Discharge: 2015-10-06 | Disposition: A | Discharge status: Home / Self Care | Payer: BLUE CROSS/BLUE SHIELD | Source: Ambulatory Visit | Attending: Obstetrics and Gynecology | Admitting: Obstetrics and Gynecology

## 2015-10-06 DIAGNOSIS — R928 Other abnormal and inconclusive findings on diagnostic imaging of breast: Secondary | ICD-10-CM

## 2016-05-23 ENCOUNTER — Other Ambulatory Visit: Payer: Self-pay | Admitting: Oncology

## 2016-05-23 DIAGNOSIS — Z86711 Personal history of pulmonary embolism: Secondary | ICD-10-CM

## 2016-05-23 DIAGNOSIS — D508 Other iron deficiency anemias: Secondary | ICD-10-CM

## 2016-06-22 ENCOUNTER — Telehealth: Payer: Self-pay | Admitting: Family Medicine

## 2016-06-22 NOTE — Telephone Encounter (Signed)
APT. REMINDER CALL, LMTCB °

## 2016-06-25 ENCOUNTER — Other Ambulatory Visit (INDEPENDENT_AMBULATORY_CARE_PROVIDER_SITE_OTHER): Payer: 59

## 2016-06-25 DIAGNOSIS — D508 Other iron deficiency anemias: Secondary | ICD-10-CM | POA: Diagnosis not present

## 2016-06-25 DIAGNOSIS — Z86711 Personal history of pulmonary embolism: Secondary | ICD-10-CM

## 2016-06-26 LAB — COMPREHENSIVE METABOLIC PANEL
ALK PHOS: 68 IU/L (ref 39–117)
ALT: 22 IU/L (ref 0–32)
AST: 23 IU/L (ref 0–40)
Albumin/Globulin Ratio: 1.4 (ref 1.2–2.2)
Albumin: 4.1 g/dL (ref 3.5–5.5)
BILIRUBIN TOTAL: 0.9 mg/dL (ref 0.0–1.2)
BUN/Creatinine Ratio: 13 (ref 9–23)
BUN: 10 mg/dL (ref 6–24)
CHLORIDE: 99 mmol/L (ref 96–106)
CO2: 23 mmol/L (ref 18–29)
CREATININE: 0.76 mg/dL (ref 0.57–1.00)
Calcium: 8.8 mg/dL (ref 8.7–10.2)
GFR calc Af Amer: 111 mL/min/{1.73_m2} (ref >59)
GFR calc non Af Amer: 96 mL/min/{1.73_m2} (ref >59)
GLUCOSE: 91 mg/dL (ref 65–99)
Globulin, Total: 3 g/dL (ref 1.5–4.5)
Potassium: 4.4 mmol/L (ref 3.5–5.2)
Sodium: 138 mmol/L (ref 134–144)
Total Protein: 7.1 g/dL (ref 6.0–8.5)

## 2016-06-26 LAB — CBC WITH DIFFERENTIAL/PLATELET
BASOS ABS: 0 10*3/uL (ref 0.0–0.2)
Basos: 0 %
EOS (ABSOLUTE): 0.2 10*3/uL (ref 0.0–0.4)
Eos: 3 %
Hematocrit: 40.4 % (ref 34.0–46.6)
Hemoglobin: 13.3 g/dL (ref 11.1–15.9)
IMMATURE GRANULOCYTES: 0 %
Immature Grans (Abs): 0 10*3/uL (ref 0.0–0.1)
Lymphocytes Absolute: 1.8 10*3/uL (ref 0.7–3.1)
Lymphs: 27 %
MCH: 26.3 pg — ABNORMAL LOW (ref 26.6–33.0)
MCHC: 32.9 g/dL (ref 31.5–35.7)
MCV: 80 fL (ref 79–97)
MONOCYTES: 8 %
MONOS ABS: 0.5 10*3/uL (ref 0.1–0.9)
NEUTROS PCT: 62 %
Neutrophils Absolute: 4.3 10*3/uL (ref 1.4–7.0)
Platelets: 297 10*3/uL (ref 150–379)
RBC: 5.05 x10E6/uL (ref 3.77–5.28)
RDW: 16.3 % — AB (ref 12.3–15.4)
WBC: 6.9 10*3/uL (ref 3.4–10.8)

## 2016-06-26 LAB — FERRITIN: FERRITIN: 16 ng/mL (ref 15–150)

## 2016-07-02 ENCOUNTER — Encounter: Payer: Self-pay | Admitting: Oncology

## 2016-07-02 ENCOUNTER — Ambulatory Visit (INDEPENDENT_AMBULATORY_CARE_PROVIDER_SITE_OTHER): Payer: 59 | Admitting: Oncology

## 2016-07-02 VITALS — BP 156/101 | HR 98 | Temp 98.3°F | Ht 64.0 in | Wt 248.8 lb

## 2016-07-02 DIAGNOSIS — E890 Postprocedural hypothyroidism: Secondary | ICD-10-CM

## 2016-07-02 DIAGNOSIS — I10 Essential (primary) hypertension: Secondary | ICD-10-CM

## 2016-07-02 DIAGNOSIS — D508 Other iron deficiency anemias: Secondary | ICD-10-CM

## 2016-07-02 DIAGNOSIS — D7589 Other specified diseases of blood and blood-forming organs: Secondary | ICD-10-CM

## 2016-07-02 DIAGNOSIS — Z882 Allergy status to sulfonamides status: Secondary | ICD-10-CM

## 2016-07-02 DIAGNOSIS — Z7982 Long term (current) use of aspirin: Secondary | ICD-10-CM

## 2016-07-02 DIAGNOSIS — Z8719 Personal history of other diseases of the digestive system: Secondary | ICD-10-CM | POA: Diagnosis not present

## 2016-07-02 DIAGNOSIS — R778 Other specified abnormalities of plasma proteins: Secondary | ICD-10-CM | POA: Diagnosis not present

## 2016-07-02 DIAGNOSIS — Z86711 Personal history of pulmonary embolism: Secondary | ICD-10-CM

## 2016-07-02 DIAGNOSIS — Z79899 Other long term (current) drug therapy: Secondary | ICD-10-CM | POA: Diagnosis not present

## 2016-07-02 MED ORDER — POTASSIUM CHLORIDE ER 10 MEQ PO TBCR: 20.0000 meq | EXTENDED_RELEASE_TABLET | Freq: Every day | ORAL | 3 refills | Status: DC

## 2016-07-02 MED ORDER — POTASSIUM CHLORIDE CRYS ER 20 MEQ PO TBCR: 20.0000 meq | EXTENDED_RELEASE_TABLET | Freq: Once | ORAL | Status: DC

## 2016-07-02 NOTE — Patient Instructions (Signed)
Increase HCTZ to 25 mg daily Start potassium 10 milliequivalents 2 tabs daily Schedule appointment with Dr Zachery DauerBarnes about your blood pressure Return visit with Dr Reece AgarG as needed

## 2016-07-02 NOTE — Progress Notes (Signed)
Hematology and Oncology Follow Up Visit  Chester HolsteinKelley S Sollenberger [id number] 08/18/1970 46 y.o. 07/02/2016 9:38 AM   Principle Diagnosis: Encounter Diagnoses  Name Primary?  Marland Kitchen. Hx of pulmonary embolus   . Other iron deficiency anemia   . HTN (hypertension), benign Yes  Clinical summary: Pleasant 46 year old woman with a history of autoimmune thyroiditis who subsequently developed provoked pulmonary emboli in June of 2003 at about 3 weeks after gallbladder surgery while she was under active evaluation for the thyroid disease. She was found to have Graves' disease and was treated with PTU and subsequently radioactive iodine. A hypercoagulation evaluation was unremarkable. There was a negative family history of clotting. I treated her for 10 years with full dose Coumadin anticoagulation and I decided to stop all anticoagulation at time of her visit here in September 2013 and just put her on one aspirin daily.   Interim History: Overall doing well.  She is concerned with progressive weight gain.  Lower extremity swelling.  She is on 12.5 mg of hydrochlorothiazide daily.  Blood pressure unacceptably high today at 156/101.  She recently had her thyroid functions checked.  She was switched to Armour Thyroid.  She thinks this might have something to do with her weight gain. She remains on 81 mg of aspirin daily.  It is now been 15 years and she has not had another thrombotic event. She denies any chest pain, palpitations, dyspnea,  calf tenderness or pain.  Medications: reviewed  Allergies:  Allergies  Allergen Reactions  . Sulfa Antibiotics Hives    Upper torso only.    Review of Systems: See interim history Remaining ROS negative:   Physical Exam: Blood pressure (!) 156/101, pulse 98, temperature 98.3 F (36.8 C), temperature source Oral, height 5\' 4"  (1.626 m), weight 248 lb 12.8 oz (112.9 kg), SpO2 98 %. Wt Readings from Last 3 Encounters:  07/02/16 248 lb 12.8 oz (112.9 kg)  07/11/15 242 lb  9.6 oz (110 kg)  07/05/14 231 lb 6.4 oz (105 kg)     General appearance: Pleasant overweight Caucasian woman HENNT: Pharynx no erythema, exudate, mass, or ulcer. No thyromegaly or thyroid nodules Lymph nodes: Breasts: Lungs: Clear to auscultation, resonant to percussion throughout Heart: Regular rhythm, no murmur, no gallop, no rub, no click, no edema Abdomen: Soft, nontender, normal bowel sounds, no mass, no organomegaly Extremities: No edema, no calf tenderness Musculoskeletal: no joint deformities GU:  Vascular: Carotid pulses 2+, no bruits, Neurologic: Alert, oriented, PERRLA, cranial nerves grossly normal, motor strength 5 over 5, reflexes 1+ symmetric, upper body coordination normal, gait normal, Skin: No rash or ecchymosis  Lab Results: CBC W/Diff    Component Value Date/Time   WBC 6.9 06/25/2016 0851   WBC 5.2 06/28/2014 0836   RBC 5.05 06/25/2016 0851   RBC 5.07 06/28/2014 0836   HGB 12.3 06/28/2014 0836   HGB 11.8 07/15/2013 1158   HCT 40.4 06/25/2016 0851   HCT 36.4 07/15/2013 1158   PLT 297 06/25/2016 0851   MCV 80 06/25/2016 0851   MCV 75.1 (L) 07/15/2013 1158   MCH 26.3 (L) 06/25/2016 0851   MCH 24.3 (L) 06/28/2014 0836   MCHC 32.9 06/25/2016 0851   MCHC 31.9 06/28/2014 0836   RDW 16.3 (H) 06/25/2016 0851   RDW 15.0 (H) 07/15/2013 1158   LYMPHSABS 1.8 06/25/2016 0851   LYMPHSABS 1.8 07/15/2013 1158   MONOABS 0.4 06/28/2014 0836   MONOABS 0.7 07/15/2013 1158   EOSABS 0.2 06/25/2016 0851   BASOSABS 0.0 06/25/2016  0851   BASOSABS 0.1 07/15/2013 1158     Chemistry      Component Value Date/Time   NA 138 06/25/2016 0851   K 4.4 06/25/2016 0851   CL 99 06/25/2016 0851   CO2 23 06/25/2016 0851   BUN 10 06/25/2016 0851   CREATININE 0.76 06/25/2016 0851      Component Value Date/Time   CALCIUM 8.8 06/25/2016 0851   ALKPHOS 68 06/25/2016 0851   AST 23 06/25/2016 0851   ALT 22 06/25/2016 0851   BILITOT 0.9 06/25/2016 0851       Radiological  Studies: No results found.  Impression:  #1.  15 years status post provoked pulmonary emboli.  Stable off all anticoagulation since 2013.   She will continue 81 mg of aspirin daily.  I told her she could graduate from our office at this time.  I will be happy to see her again in the future if the need arises.  2.  Essential hypertension Some of this may be due to increased fluid retention.  I am having her increase her diuretic from 12.5 mg to 25 mg daily.  I am starting potassium 20 mEq daily.  She is advised to get a short interim visit with her primary care physician.  She will likely need the addition of  another hypertensive medication to her regimen  3.  History of treated Graves' disease.  Now hypothyroid on replacement. 4.  History of PTU related hepatitis. 5.  Chronic microcytosis and borderline low ferritin levels with normal hemoglobin.  I told her to go back on her iron supplement.  She is still menstruating.  CC: Patient Care Team: Juluis Rainier, MD as PCP - General (Family Medicine)   [name], MD 3/5/20189:38 AM

## 2016-09-28 ENCOUNTER — Other Ambulatory Visit: Payer: Self-pay | Admitting: Obstetrics and Gynecology

## 2016-09-28 DIAGNOSIS — R928 Other abnormal and inconclusive findings on diagnostic imaging of breast: Secondary | ICD-10-CM

## 2016-10-04 ENCOUNTER — Ambulatory Visit
Admission: RE | Admit: 2016-10-04 | Discharge: 2016-10-04 | Disposition: A | Discharge status: Home / Self Care | Payer: 59 | Source: Ambulatory Visit | Attending: Obstetrics and Gynecology | Admitting: Obstetrics and Gynecology

## 2016-10-04 DIAGNOSIS — R928 Other abnormal and inconclusive findings on diagnostic imaging of breast: Secondary | ICD-10-CM

## 2017-09-05 DIAGNOSIS — Z86711 Personal history of pulmonary embolism: Secondary | ICD-10-CM | POA: Diagnosis not present

## 2017-09-05 DIAGNOSIS — E039 Hypothyroidism, unspecified: Secondary | ICD-10-CM | POA: Insufficient documentation

## 2017-09-05 DIAGNOSIS — M546 Pain in thoracic spine: Secondary | ICD-10-CM | POA: Insufficient documentation

## 2017-09-05 DIAGNOSIS — Z79899 Other long term (current) drug therapy: Secondary | ICD-10-CM | POA: Diagnosis not present

## 2017-09-05 DIAGNOSIS — R0789 Other chest pain: Secondary | ICD-10-CM | POA: Diagnosis present

## 2017-09-05 NOTE — ED Notes (Signed)
Pt received 250 mcg of fentanyl in route by EMS

## 2017-09-05 NOTE — ED Triage Notes (Signed)
Pt complains of right rib pain this evening Usually the pain is quick and is gone but tonight it's last a few hours

## 2017-09-06 ENCOUNTER — Emergency Department (HOSPITAL_COMMUNITY): Payer: BLUE CROSS/BLUE SHIELD

## 2017-09-06 ENCOUNTER — Other Ambulatory Visit: Payer: Self-pay

## 2017-09-06 ENCOUNTER — Emergency Department (HOSPITAL_COMMUNITY)
Admission: EM | Admit: 2017-09-06 | Discharge: 2017-09-06 | Disposition: A | Discharge status: Home / Self Care | Payer: BLUE CROSS/BLUE SHIELD | Attending: Emergency Medicine | Admitting: Emergency Medicine

## 2017-09-06 ENCOUNTER — Encounter (HOSPITAL_COMMUNITY): Payer: Self-pay

## 2017-09-06 DIAGNOSIS — M546 Pain in thoracic spine: Secondary | ICD-10-CM

## 2017-09-06 LAB — URINALYSIS, ROUTINE W REFLEX MICROSCOPIC
Bilirubin Urine: NEGATIVE
Glucose, UA: NEGATIVE mg/dL
Hgb urine dipstick: NEGATIVE
KETONES UR: NEGATIVE mg/dL
Nitrite: NEGATIVE
PROTEIN: NEGATIVE mg/dL
Specific Gravity, Urine: 1.008 (ref 1.005–1.030)
pH: 7 (ref 5.0–8.0)

## 2017-09-06 LAB — CBC WITH DIFFERENTIAL/PLATELET
Basophils Absolute: 0 10*3/uL (ref 0.0–0.1)
Basophils Relative: 1 %
EOS PCT: 2 %
Eosinophils Absolute: 0.2 10*3/uL (ref 0.0–0.7)
HEMATOCRIT: 42 % (ref 36.0–46.0)
Hemoglobin: 13.5 g/dL (ref 12.0–15.0)
LYMPHS PCT: 29 %
Lymphs Abs: 2.6 10*3/uL (ref 0.7–4.0)
MCH: 25.4 pg — ABNORMAL LOW (ref 26.0–34.0)
MCHC: 32.1 g/dL (ref 30.0–36.0)
MCV: 79.1 fL (ref 78.0–100.0)
MONO ABS: 1 10*3/uL (ref 0.1–1.0)
MONOS PCT: 12 %
NEUTROS ABS: 4.9 10*3/uL (ref 1.7–7.7)
Neutrophils Relative %: 56 %
PLATELETS: 275 10*3/uL (ref 150–400)
RBC: 5.31 MIL/uL — ABNORMAL HIGH (ref 3.87–5.11)
RDW: 14.9 % (ref 11.5–15.5)
WBC: 8.7 10*3/uL (ref 4.0–10.5)

## 2017-09-06 LAB — COMPREHENSIVE METABOLIC PANEL
ALT: 19 U/L (ref 14–54)
ANION GAP: 10 (ref 5–15)
AST: 22 U/L (ref 15–41)
Albumin: 4.1 g/dL (ref 3.5–5.0)
Alkaline Phosphatase: 61 U/L (ref 38–126)
BUN: 9 mg/dL (ref 6–20)
CO2: 22 mmol/L (ref 22–32)
Calcium: 8.9 mg/dL (ref 8.9–10.3)
Chloride: 103 mmol/L (ref 101–111)
Creatinine, Ser: 0.66 mg/dL (ref 0.44–1.00)
Glucose, Bld: 92 mg/dL (ref 65–99)
POTASSIUM: 3.2 mmol/L — AB (ref 3.5–5.1)
Sodium: 135 mmol/L (ref 135–145)
Total Bilirubin: 1.6 mg/dL — ABNORMAL HIGH (ref 0.3–1.2)
Total Protein: 7.9 g/dL (ref 6.5–8.1)

## 2017-09-06 LAB — PREGNANCY, URINE: Preg Test, Ur: NEGATIVE

## 2017-09-06 MED ORDER — HYDROCODONE-ACETAMINOPHEN 5-325 MG PO TABS: 1.0000 | ORAL_TABLET | ORAL | 0 refills | Status: DC | PRN

## 2017-09-06 MED ORDER — IBUPROFEN 800 MG PO TABS: 800.0000 mg | ORAL_TABLET | Freq: Three times a day (TID) | ORAL | 0 refills | Status: AC | PRN

## 2017-09-06 MED ORDER — HYDROMORPHONE HCL 1 MG/ML IJ SOLN
1.0000 mg | Freq: Once | INTRAMUSCULAR | Status: AC
Start: 2017-09-06 — End: 2017-09-06
  Administered 2017-09-06: 1 mg via INTRAVENOUS
  Filled 2017-09-06: qty 1

## 2017-09-06 MED ORDER — METHOCARBAMOL 500 MG PO TABS: 500.0000 mg | ORAL_TABLET | Freq: Three times a day (TID) | ORAL | 0 refills | Status: DC | PRN

## 2017-09-06 MED ORDER — OXYCODONE-ACETAMINOPHEN 5-325 MG PO TABS
1.0000 | ORAL_TABLET | ORAL | Status: DC | PRN
  Administered 2017-09-06: 1 via ORAL
  Filled 2017-09-06: qty 1

## 2017-09-06 MED ORDER — ONDANSETRON HCL 4 MG/2ML IJ SOLN
4.0000 mg | Freq: Once | INTRAMUSCULAR | Status: AC
  Administered 2017-09-06: 4 mg via INTRAVENOUS
  Filled 2017-09-06: qty 2

## 2017-09-06 MED ORDER — IOPAMIDOL (ISOVUE-370) INJECTION 76%
100.0000 mL | Freq: Once | INTRAVENOUS | Status: AC | PRN
  Administered 2017-09-06: 100 mL via INTRAVENOUS

## 2017-09-06 MED ORDER — IOPAMIDOL (ISOVUE-370) INJECTION 76%
INTRAVENOUS | Status: AC
  Administered 2017-09-06: 100 mL via INTRAVENOUS
  Filled 2017-09-06: qty 100

## 2017-09-06 NOTE — ED Triage Notes (Signed)
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

## 2017-09-06 NOTE — ED Provider Notes (Signed)
TIME SEEN: 4:31 AM  CHIEF COMPLAINT: Right lateral rib pain  HPI: Patient is a 47 year old female with history of PE no longer on anticoagulation who presents to the emergency department 6 months of intermittent severe right sided lateral chest pain that radiates into her back.  States she is been seen by a chiropractor for the past 6 months for this pain and has some days that are better than others.  Unable to get her pain controlled with over-the-counter medications.  States tonight she bent over and then had worsening pain in her back, tightening and felt like she could not catch her breath.  No lower extremity swelling or pain.  No other chest pain.  No fever or cough.  No dysuria or hematuria.  No history of kidney stones.  States when she had her previous PE she was tachycardic and short of breath but did not have pain.  Has been off anticoagulation for 2 years.  She denies numbness, tingling, focal weakness, bowel or bladder incontinence, urinary retention.  ROS: See HPI Constitutional: no fever  Eyes: no drainage  ENT: no runny nose   Cardiovascular: Right lateral chest pain  Resp:  SOB  GI: no vomiting GU: no dysuria Integumentary: no rash  Allergy: no hives  Musculoskeletal: no leg swelling  Neurological: no slurred speech ROS otherwise negative  PAST MEDICAL HISTORY/PAST SURGICAL HISTORY:  Past Medical History:  Diagnosis Date  . Abdominal distension   . Abdominal wall hernia   . Anemia, iron deficiency 07/05/2014  . Clotting disorder (HCC)   . Edema   . H/O hiatal hernia    at birth  repaired as a baby.   Marland Kitchen Hx of pulmonary embolus 10/03/2001  . Hyperlipemia   . Hypothyroid   . Iron deficiency anemia, unspecified   . Leg swelling   . Migraine   . Nausea   . Shortness of breath    with exertion     MEDICATIONS:  Prior to Admission medications   Medication Sig Start Date End Date Taking? Authorizing Provider  ARMOUR THYROID 120 MG tablet Take 120 mg by mouth daily.  07/29/17  Yes [provider]  aspirin 81 MG tablet Take 81 mg by mouth daily.   Yes [provider]  hydrochlorothiazide (HYDRODIURIL) 25 MG tablet Take 25 mg by mouth daily.   Yes Juluis Rainier, MD  iron polysaccharides (NIFEREX) 150 MG capsule Take 1 capsule (150 mg total) by mouth daily. Patient not taking: Reported on 09/06/2017 07/11/15   [name], MD  potassium chloride (K-DUR) 10 MEQ tablet Take 2 tablets (20 mEq total) by mouth daily. Patient not taking: Reported on 09/06/2017 07/02/16   [name], MD    ALLERGIES:  Allergies  Allergen Reactions  . Sulfa Antibiotics Hives    Upper torso only.    SOCIAL HISTORY:  Social History   Tobacco Use  . Smoking status: Never Smoker  . Smokeless tobacco: Never Used  Substance Use Topics  . Alcohol use: No    Alcohol/week: 0.0 oz    FAMILY HISTORY: History reviewed. No pertinent family history.  EXAM: BP (!) 163/95 (BP Location: Right Arm)   Pulse 84   Temp 98.7 F (37.1 C) (Oral)   Resp 20   Ht  (1.626 m)   Wt 108.9 kg (240 lb)   LMP 08/17/2017   SpO2 99%   BMI 41.20 kg/m  CONSTITUTIONAL: Alert and oriented and responds appropriately to questions. Well-appearing; well-nourished HEAD: Normocephalic  EYES: Conjunctivae clear, pupils appear equal, EOMI ENT: normal nose; moist mucous membranes NECK: Supple, no meningismus, no nuchal rigidity, no LAD  CARD: RRR; S1 and S2 appreciated; no murmurs, no clicks, no rubs, no gallops CHEST:  Chest wall is tender to palpation over the right lateral ribs and right posterior lower ribs.  No crepitus, ecchymosis, erythema, warmth, rash or other lesions present.   RESP: Normal chest excursion without splinting or tachypnea; breath sounds clear and equal bilaterally; no wheezes, no rhonchi, no rales, no hypoxia or respiratory distress, speaking full sentences ABD/GI: Normal bowel sounds; non-distended; soft, non-tender, no rebound, no guarding,  no peritoneal signs, no hepatosplenomegaly BACK:  The back appears normal and is non-tender to palpation, there is no CVA tenderness, no midline spinal tenderness or step-off or deformity EXT: Normal ROM in all joints; non-tender to palpation; no edema; normal capillary refill; no cyanosis, no calf tenderness or swelling    SKIN: Normal color for age and race; warm; no rash NEURO: Moves all extremities equally, normal sensation diffusely, cranial nerves II through XII intact, normal speech PSYCH: The patient's mood and manner are appropriate. Grooming and personal hygiene are appropriate.  MEDICAL DECISION MAKING: Patient here with complaints of right back pain.  She has no urinary symptoms but urine does show 21-50 white blood cells but with rare bacteria.  We will send urine culture.  Low suspicion that this is pyelonephritis.  I am actually more concerned that this could be pulmonary embolus given her previous history and now shortness of breath with unrelenting pain.  Given fentanyl with EMS.  Will give Dilaudid here.  Will obtain labs, EKG and a CT of her chest for further evaluation.  ED PROGRESS: Patient's labs are unremarkable.  No leukocytosis.  Normal LFTs.  CT scan shows the patient is status post cholecystectomy.  No musculoskeletal findings seen on CT imaging.  No pulmonary embolus.  Likely musculoskeletal pain.  Will discharge with prescriptions of Robaxin, Vicodin, ibuprofen.  She will follow-up with her primary care physician.  Discussed massage, stretching, heat therapy.  Discussed return precautions.  I do not think this is pyelonephritis.  No sign of pneumonia or edema on CT imaging.  Doubt kidney stone.  Patient reports feeling much better and is comfortable with this plan for discharge home.  At this time, I do not feel there is any life-threatening condition present. I have reviewed and discussed all results (EKG, imaging, lab, urine as appropriate) and exam findings with  patient/family. I have reviewed nursing notes and appropriate previous records.  I feel the patient is safe to be discharged home without further emergent workup and can continue workup as an outpatient as needed. Discussed usual and customary return precautions. Patient/family verbalize understanding and are comfortable with this plan.  Outpatient follow-up has been provided if needed. All questions have been answered.      EKG Interpretation  Date/Time:  Friday Sep 06 2017 04:35:02 EDT Ventricular Rate:  69 PR Interval:    QRS Duration: 96 QT Interval:  444 QTC Calculation: 476 R Axis:   33 Text Interpretation:  Sinus rhythm Confirmed by Rochele Raring [phone]) on 09/06/2017 5:07:05 AM         Zeb Rawl, Layla Maw, DO 09/06/17 1607

## 2017-09-07 LAB — URINE CULTURE

## 2019-01-13 ENCOUNTER — Other Ambulatory Visit: Payer: Self-pay | Admitting: Obstetrics and Gynecology

## 2019-01-13 DIAGNOSIS — R928 Other abnormal and inconclusive findings on diagnostic imaging of breast: Secondary | ICD-10-CM

## 2019-01-16 ENCOUNTER — Other Ambulatory Visit: Payer: BLUE CROSS/BLUE SHIELD

## 2019-01-22 ENCOUNTER — Ambulatory Visit
Admission: RE | Admit: 2019-01-22 | Discharge: 2019-01-22 | Disposition: A | Discharge status: Home / Self Care | Payer: BC Managed Care – PPO | Source: Ambulatory Visit | Attending: Obstetrics and Gynecology | Admitting: Obstetrics and Gynecology

## 2019-01-22 ENCOUNTER — Other Ambulatory Visit: Payer: Self-pay

## 2019-01-22 DIAGNOSIS — R928 Other abnormal and inconclusive findings on diagnostic imaging of breast: Secondary | ICD-10-CM

## 2019-03-09 ENCOUNTER — Other Ambulatory Visit: Payer: Self-pay

## 2019-03-09 DIAGNOSIS — Z20822 Contact with and (suspected) exposure to covid-19: Secondary | ICD-10-CM

## 2019-03-11 LAB — NOVEL CORONAVIRUS, NAA: SARS-CoV-2, NAA: NOT DETECTED

## 2019-08-14 ENCOUNTER — Other Ambulatory Visit: Payer: Self-pay | Admitting: Radiology

## 2019-08-14 DIAGNOSIS — N63 Unspecified lump in unspecified breast: Secondary | ICD-10-CM

## 2019-08-20 ENCOUNTER — Other Ambulatory Visit: Payer: BC Managed Care – PPO

## 2019-09-09 ENCOUNTER — Ambulatory Visit
Admission: RE | Admit: 2019-09-09 | Discharge: 2019-09-09 | Disposition: A | Discharge status: Home / Self Care | Payer: BC Managed Care – PPO | Source: Ambulatory Visit | Attending: Radiology | Admitting: Radiology

## 2019-09-09 ENCOUNTER — Other Ambulatory Visit: Payer: Self-pay

## 2019-09-09 DIAGNOSIS — N63 Unspecified lump in unspecified breast: Secondary | ICD-10-CM

## 2019-09-25 ENCOUNTER — Other Ambulatory Visit: Payer: Self-pay

## 2019-09-25 ENCOUNTER — Other Ambulatory Visit: Payer: Self-pay | Admitting: Radiology

## 2019-09-25 ENCOUNTER — Ambulatory Visit
Admission: RE | Admit: 2019-09-25 | Discharge: 2019-09-25 | Disposition: A | Discharge status: Home / Self Care | Payer: BC Managed Care – PPO | Source: Ambulatory Visit | Attending: Radiology | Admitting: Radiology

## 2019-09-25 DIAGNOSIS — N611 Abscess of the breast and nipple: Secondary | ICD-10-CM

## 2019-12-31 ENCOUNTER — Ambulatory Visit: Payer: BC Managed Care – PPO | Admitting: Cardiology

## 2020-02-01 ENCOUNTER — Encounter: Payer: Self-pay | Admitting: Cardiology

## 2020-02-01 ENCOUNTER — Ambulatory Visit (INDEPENDENT_AMBULATORY_CARE_PROVIDER_SITE_OTHER): Payer: BC Managed Care – PPO | Admitting: Cardiology

## 2020-02-01 ENCOUNTER — Encounter: Payer: Self-pay | Admitting: *Deleted

## 2020-02-01 ENCOUNTER — Other Ambulatory Visit: Payer: Self-pay

## 2020-02-01 VITALS — BP 136/92 | HR 86 | Ht 63.0 in | Wt 240.6 lb

## 2020-02-01 DIAGNOSIS — I341 Nonrheumatic mitral (valve) prolapse: Secondary | ICD-10-CM

## 2020-02-01 DIAGNOSIS — R600 Localized edema: Secondary | ICD-10-CM

## 2020-02-01 DIAGNOSIS — R002 Palpitations: Secondary | ICD-10-CM

## 2020-02-01 DIAGNOSIS — Z8249 Family history of ischemic heart disease and other diseases of the circulatory system: Secondary | ICD-10-CM

## 2020-02-01 DIAGNOSIS — R0609 Other forms of dyspnea: Secondary | ICD-10-CM

## 2020-02-01 DIAGNOSIS — Z7189 Other specified counseling: Secondary | ICD-10-CM

## 2020-02-01 DIAGNOSIS — R0600 Dyspnea, unspecified: Secondary | ICD-10-CM

## 2020-02-01 NOTE — Progress Notes (Signed)
Patient ID: Lindsey Boone, female   DOB: 1970-12-28, 49 y.o.   MRN: [id number] 7 day ZIO XT long term holter monitor shipped to patients home.

## 2020-02-01 NOTE — Patient Instructions (Signed)
Medication Instructions:  Your Physician recommend you continue on your current medication as directed.    *If you need a refill on your cardiac medications before your next appointment, please call your pharmacy*   Lab Work: None ordered   Testing/Procedures: Your physician has requested that you have an echocardiogram. Echocardiography is a painless test that uses sound waves to create images of your heart. It provides your doctor with information about the size and shape of your heart and how well your hearts chambers and valves are working. This procedure takes approximately one hour. There are no restrictions for this procedure. [address]  Our physician has recommended that you wear an 7  DAY ZIO-PATCH monitor. The Zio patch cardiac monitor continuously records heart rhythm data for up to 14 days, this is for patients being evaluated for multiple types heart rhythms. For the first 24 hours post application, please avoid getting the Zio monitor wet in the shower or by excessive sweating during exercise. After that, feel free to carry on with regular activities. Keep soaps and lotions away from the ZIO XT Patch.   Someone from our office will call to verify address and mail monitor.     Follow-Up: At [facility], you and your health needs are our priority.  As part of our continuing mission to provide you with exceptional heart care, we have created designated Provider Care Teams.  These Care Teams include your primary Cardiologist (physician) and Advanced Practice Providers (APPs -  Physician Assistants and Nurse Practitioners) who all work together to provide you with the care you need, when you need it.  We recommend signing up for the patient portal called "MyChart".  Sign up information is provided on this After Visit Summary.  MyChart is used to connect with patients for Virtual Visits (Telemedicine).  Patients are able to view lab/test results, encounter  notes, upcoming appointments, etc.  Non-urgent messages can be sent to your provider as well.   To learn more about what you can do with MyChart, go to [email or web address].    Your next appointment:   6 week(s)  The format for your next appointment:   In Person  Provider:   Jodelle Red, MD  Christena Deem- Long Term Monitor Instructions   Your physician has requested you wear your ZIO patch monitor___7____days.   This is a single patch monitor.  Ir[employer] supplies one patch monitor per enrollment.  Additional stickers are not available.   Please do not apply patch if you will be having a Nuclear Stress Test, Echocardiogram, Cardiac CT, MRI, or Chest Xray during the time frame you would be wearing the monitor. The patch cannot be worn during these tests.  You cannot remove and re-apply the ZIO XT patch monitor.   Your ZIO patch monitor will be sent USPS Priority mail from [facility] directly to your home address. The monitor may also be mailed to a PO BOX if home delivery is not available.   It may take 3-5 days to receive your monitor after you have been enrolled.   Once you have received you monitor, please review enclosed instructions.  Your monitor has already been registered assigning a specific monitor serial # to you.   Applying the monitor   Shave hair from upper left chest.   Hold abrader disc by orange tab.  Rub abrader in 40 strokes over left upper chest as indicated in your monitor instructions.   Clean area with 4 enclosed alcohol  pads .  Use all pads to assure are is cleaned thoroughly.  Let dry.   Apply patch as indicated in monitor instructions.  Patch will be place under collarbone on left side of chest with arrow pointing upward.   Rub patch adhesive wings for 2 minutes.Remove white label marked "1".  Remove white label marked "2".  Rub patch adhesive wings for 2 additional minutes.   While looking in a mirror, press and release button in center of  patch.  A small green light will flash 3-4 times .  This will be your only indicator the monitor has been turned on.     Do not shower for the first 24 hours.  You may shower after the first 24 hours.   Press button if you feel a symptom. You will hear a small click.  Record Date, Time and Symptom in the Patient Log Book.   When you are ready to remove patch, follow instructions on last 2 pages of Patient Log Book.  Stick patch monitor onto last page of Patient Log Book.   Place Patient Log Book in [place] box.  Use locking tab on box and tape box closed securely.  The Orange and [employer] has [employer] on it.  Please place in mailbox as soon as possible.  Your physician should have your test results approximately 7 days after the monitor has been mailed back to [facility].   Call [facility] Customer Care at [phone] if you have questions regarding your ZIO XT patch monitor.  Call them immediately if you see an orange light blinking on your monitor.   If your monitor falls off in less than 4 days contact our Monitor department at [phone].  If your monitor becomes loose or falls off after 4 days call [employer] at [phone] for suggestions on securing your monitor.

## 2020-02-01 NOTE — Progress Notes (Signed)
Cardiology Office Note:    Date:  02/01/2020   ID:  Lindsey Boone, DOB 08-09-1970, MRN [id number]  PCP:  Juluis Rainier, MD  Cardiologist:  Jodelle Red, MD  Referring MD: Juluis Rainier, MD   CC: new patient consultation for palpitations  History of Present Illness:    Lindsey Boone is a 49 y.o. female with a hx of mitral valve prolapse, hypothyroidism, hypertension, prior PE who is seen as a new consult at the request of Juluis Rainier, MD for the evaluation and management of palpitations.  Note reviewed from 12/10/19 with Dr. Zachery Dauer. Noted 3 weeks of heart palpitations, causes her to cough. She can hear and feel her heartbeat in her ears. Noted weak R radial pulse and left leg swelling > right. Has history of PE in 2003, was on coumadin until 2014. Labs at that visit noted low TSH of 0.28 (on armour thyroid)  Tachycardia/palpitations: -Initial onset: has noticed for several years, but recently worse -Frequency/Duration: 3-4 days/week on average. Some days it never happens, some days it happens all day -Associated symptoms: lightheadedness, short of breath -Aggravating/alleviating factors: worse with caffeine. Gets out of breath getting out of the shower. -Syncope/near syncope: none -Prior cardiac history: told she had mitral valve prolapse -Prior workup: reports ~2005 wore a heart monitor for palpitations, told nothing high risk -Prior treatment: none -Possible medication interactions: on armour thyroid -Caffeine: tries to avoid, but much worse when she does have caffeine -Alcohol: none -Tobacco: never -OTC supplements: reviewed -Comorbidities: long term history of leg swelling, even when she was thin in her 13s, and this was the original reason for her echo -Exercise level: largely sedentary -Labs: TSH, kidney function/electrolytes, CBC reviewed. TSH slightly low on available labs. Lowered her armour thyroid with some improvement but not resolved. -Cardiac  ROS: no chest pain, no shortness of breath, no PND, no orthopnea. -Family history: doesn't know much about her father's side. Mat Gpa died of MI at age 43. Mat Gma without heart issues. Mother (also my patient) without clear cardiac etiology for her symptoms. Brother is healthy, kids are healthy.  Past Medical History:  Diagnosis Date  . Abdominal distension   . Abdominal wall hernia   . Anemia, iron deficiency 07/05/2014  . Clotting disorder (HCC)   . Edema   . H/O hiatal hernia    at birth  repaired as a baby.   Marland Kitchen Hx of pulmonary embolus 10/03/2001  . Hyperlipemia   . Hypothyroid   . Iron deficiency anemia, unspecified   . Leg swelling   . Migraine   . Nausea   . Shortness of breath    with exertion     Past Surgical History:  Procedure Laterality Date  . CESAREAN SECTION  08/20/96  . CHOLECYSTECTOMY  09/2001  . HERNIA REPAIR  08/28/2011  . UMBILICAL HERNIA REPAIR     w/o obst or gangrene  . VENTRAL HERNIA REPAIR  08/28/2011   Procedure: HERNIA REPAIR VENTRAL ADULT;  Surgeon: [name]. [name], MD;  Location: [facility] OR;  Service: General;  Laterality: N/A;  Open Ventral Hernia Repair with Mesh    Current Medications: Current Outpatient Medications on File Prior to Visit  Medication Sig  . ARMOUR THYROID 120 MG tablet Take 120 mg by mouth daily.  Marland Kitchen aspirin 81 MG tablet Take 81 mg by mouth daily.  . hydrochlorothiazide (HYDRODIURIL) 25 MG tablet Take 25 mg by mouth daily.  Marland Kitchen HYDROcodone-acetaminophen (NORCO/VICODIN) 5-325 MG tablet Take 1 tablet by mouth  every 4 (four) hours as needed.  Marland Kitchen ibuprofen (ADVIL,MOTRIN) 800 MG tablet Take 1 tablet (800 mg total) by mouth every 8 (eight) hours as needed for mild pain.  . iron polysaccharides (NIFEREX) 150 MG capsule Take 1 capsule (150 mg total) by mouth daily. (Patient not taking: Reported on 09/06/2017)  . methocarbamol (ROBAXIN) 500 MG tablet Take 1 tablet (500 mg total) by mouth every 8 (eight) hours as needed for muscle spasms.  .  potassium chloride (K-DUR) 10 MEQ tablet Take 2 tablets (20 mEq total) by mouth daily. (Patient not taking: Reported on 09/06/2017)   Current Facility-Administered Medications on File Prior to Visit  Medication  . potassium chloride SA (K-DUR,KLOR-CON) CR tablet 20 mEq     Allergies:   Sulfa antibiotics   Social History   Tobacco Use  . Smoking status: Never Smoker  . Smokeless tobacco: Never Used  Substance Use Topics  . Alcohol use: No    Alcohol/week: 0.0 standard drinks  . Drug use: No    Family History: doesn't know much about her father's side. Mat Gpa died of MI at age 22. Mat Gma without heart issues. Mother (also my patient) without clear cardiac etiology for her symptoms. Brother is healthy, kids are healthy.  ROS:   Please see the history of present illness.  Additional pertinent ROS: Constitutional: Negative for chills, fever, night sweats, unintentional weight loss  HENT: Negative for ear pain and hearing loss.   Eyes: Negative for loss of vision and eye pain.  Respiratory: Negative for cough, sputum, wheezing.   Cardiovascular: See HPI. Gastrointestinal: Negative for abdominal pain, melena, and hematochezia.  Genitourinary: Negative for dysuria and hematuria.  Musculoskeletal: Negative for falls and myalgias.  Skin: Negative for itching and rash.  Neurological: Negative for focal weakness, focal sensory changes and loss of consciousness.  Endo/Heme/Allergies: Does not bruise/bleed easily.     EKGs/Labs/Other Studies Reviewed:    The following studies were reviewed today: No prior cardiac studies  EKG:  EKG is personally reviewed.  The ekg ordered today demonstrates Sinus rhythm with PAC, HR 86 bpm  Recent Labs: No results found for requested labs within last 8760 hours.  Recent Lipid Panel No results found for: CHOL, TRIG, HDL, CHOLHDL, VLDL, LDLCALC, LDLDIRECT  Physical Exam:    VS:  BP (!) 136/92   Pulse 86   Ht 5\' 3"  (1.6 m)   Wt 240 lb 9.6 oz  (109.1 kg)   BMI 42.62 kg/m     Wt Readings from Last 3 Encounters:  02/01/20 240 lb 9.6 oz (109.1 kg)  09/06/17 240 lb (108.9 kg)  07/02/16 248 lb 12.8 oz (112.9 kg)    GEN: Well nourished, well developed in no acute distress HEENT: Normal, moist mucous membranes NECK: No JVD CARDIAC: regular rhythm, normal S1 and S2, no rubs or gallops. No murmurs. VASCULAR: Radial and DP pulses 2+ bilaterally. No carotid bruits RESPIRATORY:  Clear to auscultation without rales, wheezing or rhonchi  ABDOMEN: Soft, non-tender, non-distended MUSCULOSKELETAL:  Ambulates independently SKIN: Warm and dry, trivial LE bilateral edema NEUROLOGIC:  Alert and oriented x 3. No focal neuro deficits noted. PSYCHIATRIC:  Normal affect    ASSESSMENT:    1. Heart palpitations   2. MVP (mitral valve prolapse)   3. Bilateral leg edema   4. Family history of heart disease   5. Dyspnea on exertion   6. Cardiac risk counseling   7. Counseling on health promotion and disease prevention    PLAN:  Palpitations, history of mitral valve prolapse, dyspnea on exertion, chronic bilateral LE edema: -we discussed the potential causes of fast heart rates and palpitations today. Reviewed the normal electrical system of the heart. Reviewed the role of the sinus node. Reviewed the balance between resting (vagal) tone and fight or flight nervous system input. Reviewed how exercise improves vagal tone and lowers resting heart rate. Reviewed that sinus tachycardia, or elevated sinus rate, is usually secondary to something else in the body. This can include pain, stress, infection, anxiety, hormone imbalance, low blood counts, etc. Discussed that we do not typically treat sinus tachycardia by itself, and instead the focus is on finding what is driving the heart rate and treating that. We discussed that there can be other rhythm issues, from either the top or bottom of the heart, that are abnormal rhythms. Discussed how we evaluate  for these. -will get Zio monitor and echocardiogram for further evaluation  Family history of heart disease Cardiac risk counseling and prevention recommendations: -recommend heart healthy/Mediterranean diet, with whole grains, fruits, vegetable, fish, lean meats, nuts, and olive oil. Limit salt. -recommend moderate walking, 3-5 times/week for 30-50 minutes each session. Aim for at least 150 minutes.week. Goal should be pace of 3 miles/hours, or walking 1.5 miles in 30 minutes -recommend avoidance of tobacco products. Avoid excess alcohol. -ASCVD risk score: The ASCVD Risk score Denman George DC Jr., et al., 2013) failed to calculate for the following reasons:   Cannot find a previous HDL lab   Cannot find a previous total cholesterol lab   -lipids per [facility] 01/13/2019: Tchol 156, HDL 37, LDL 94, TG 126  Plan for follow up: 6 weeks to review symptoms and results  Jodelle Red, MD, PhD [place]  [facility]    Medication Adjustments/Labs and Tests Ordered: Current medicines are reviewed at length with the patient today.  Concerns regarding medicines are outlined above.  Orders Placed This Encounter  Procedures  . LONG TERM MONITOR (3-14 DAYS)  . EKG 12-Lead  . ECHOCARDIOGRAM COMPLETE   No orders of the defined types were placed in this encounter.   Patient Instructions  Medication Instructions:  Your Physician recommend you continue on your current medication as directed.    *If you need a refill on your cardiac medications before your next appointment, please call your pharmacy*   Lab Work: None ordered   Testing/Procedures: Your physician has requested that you have an echocardiogram. Echocardiography is a painless test that uses sound waves to create images of your heart. It provides your doctor with information about the size and shape of your heart and how well your heart's chambers and valves are working. This procedure takes approximately one hour. There are no  restrictions for this procedure. [address]  Our physician has recommended that you wear an 7  DAY ZIO-PATCH monitor. The Zio patch cardiac monitor continuously records heart rhythm data for up to 14 days, this is for patients being evaluated for multiple types heart rhythms. For the first 24 hours post application, please avoid getting the Zio monitor wet in the shower or by excessive sweating during exercise. After that, feel free to carry on with regular activities. Keep soaps and lotions away from the ZIO XT Patch.   Someone from our office will call to verify address and mail monitor.     Follow-Up: At [facility], you and your health needs are our priority.  As part of our continuing mission to provide you  with exceptional heart care, we have created designated Provider Care Teams.  These Care Teams include your primary Cardiologist (physician) and Advanced Practice Providers (APPs -  Physician Assistants and Nurse Practitioners) who all work together to provide you with the care you need, when you need it.  We recommend signing up for the patient portal called "MyChart".  Sign up information is provided on this After Visit Summary.  MyChart is used to connect with patients for Virtual Visits (Telemedicine).  Patients are able to view lab/test results, encounter notes, upcoming appointments, etc.  Non-urgent messages can be sent to your provider as well.   To learn more about what you can do with MyChart, go to [email or web address].    Your next appointment:   6 week(s)  The format for your next appointment:   In Person  Provider:   Jodelle RedBridgette Crystallynn Noorani, MD  Christena DeemZIO XT- Long Term Monitor Instructions   Your physician has requested you wear your ZIO patch monitor___7____days.   This is a single patch monitor.  [employer] supplies one patch monitor per enrollment.  Additional stickers are not available.   Please do not apply patch if you will be having a Nuclear  Stress Test, Echocardiogram, Cardiac CT, MRI, or Chest Xray during the time frame you would be wearing the monitor. The patch cannot be worn during these tests.  You cannot remove and re-apply the ZIO XT patch monitor.   Your ZIO patch monitor will be sent USPS Priority mail [facility] directly to your home address. The monitor may also be mailed to a PO BOX if home delivery is not available.   It may take 3-5 days to receive your monitor after you have been enrolled.   Once you have received you monitor, please review enclosed instructions.  Your monitor has already been registered assigning a specific monitor serial # to you.   Applying the monitor   Shave hair from upper left chest.   Hold abrader disc by orange tab.  Rub abrader in 40 strokes over left upper chest as indicated in your monitor instructions.   Clean area with 4 enclosed alcohol pads .  Use all pads to assure are is cleaned thoroughly.  Let dry.   Apply patch as indicated in monitor instructions.  Patch will be place under collarbone on left side of chest with arrow pointing upward.   Rub patch adhesive wings for 2 minutes.Remove white label marked "1".  Remove white label marked "2".  Rub patch adhesive wings for 2 additional minutes.   While looking in a mirror, press and release button in center of patch.  A small green light will flash 3-4 times .  This will be your only indicator the monitor has been turned on.     Do not shower for the first 24 hours.  You may shower after the first 24 hours.   Press button if you feel a symptom. You will hear a small click.  Record Date, Time and Symptom in the Patient Log Book.   When you are ready to remove patch, follow instructions on last 2 pages of Patient Log Book.  Stick patch monitor onto last page of Patient Log Book.   Place Patient Log Book in [place] box.  Use locking tab on box and tape box closed securely.  The [employer] box has [employer] postage on  it.  Please place in mailbox as soon as possible.  Your physician should have your test results approximately  7 days after the monitor has been mailed back to [place].   Call [facility] Customer Care at [phone] if you have questions regarding your ZIO XT patch monitor.  Call them immediately if you see an orange light blinking on your monitor.   If your monitor falls off in less than 4 days contact our Monitor department at [phone].  If your monitor becomes loose or falls off after 4 days call [employer] at [phone] for suggestions on securing your monitor.      Signed, Jodelle Red, MD PhD 02/01/2020    [facility]

## 2020-02-05 ENCOUNTER — Ambulatory Visit (INDEPENDENT_AMBULATORY_CARE_PROVIDER_SITE_OTHER): Payer: BC Managed Care – PPO

## 2020-02-05 DIAGNOSIS — R002 Palpitations: Secondary | ICD-10-CM

## 2020-02-05 DIAGNOSIS — Z8249 Family history of ischemic heart disease and other diseases of the circulatory system: Secondary | ICD-10-CM

## 2020-02-05 DIAGNOSIS — R0609 Other forms of dyspnea: Secondary | ICD-10-CM

## 2020-02-05 DIAGNOSIS — I341 Nonrheumatic mitral (valve) prolapse: Secondary | ICD-10-CM

## 2020-02-05 DIAGNOSIS — R600 Localized edema: Secondary | ICD-10-CM

## 2020-02-05 DIAGNOSIS — R0600 Dyspnea, unspecified: Secondary | ICD-10-CM

## 2020-02-14 ENCOUNTER — Encounter: Payer: Self-pay | Admitting: Cardiology

## 2020-02-24 ENCOUNTER — Ambulatory Visit (HOSPITAL_COMMUNITY): Payer: BC Managed Care – PPO | Attending: Cardiology

## 2020-02-24 ENCOUNTER — Other Ambulatory Visit: Payer: Self-pay

## 2020-02-24 DIAGNOSIS — Z8249 Family history of ischemic heart disease and other diseases of the circulatory system: Secondary | ICD-10-CM | POA: Diagnosis not present

## 2020-02-24 DIAGNOSIS — R600 Localized edema: Secondary | ICD-10-CM | POA: Diagnosis not present

## 2020-02-24 DIAGNOSIS — I341 Nonrheumatic mitral (valve) prolapse: Secondary | ICD-10-CM

## 2020-02-24 DIAGNOSIS — R0600 Dyspnea, unspecified: Secondary | ICD-10-CM | POA: Diagnosis present

## 2020-02-24 DIAGNOSIS — R0609 Other forms of dyspnea: Secondary | ICD-10-CM

## 2020-02-24 DIAGNOSIS — R002 Palpitations: Secondary | ICD-10-CM | POA: Insufficient documentation

## 2020-02-24 LAB — ECHOCARDIOGRAM COMPLETE
Area-P 1/2: 5.38 cm2
S' Lateral: 3.2 cm

## 2020-02-24 MED ORDER — PERFLUTREN LIPID MICROSPHERE
1.0000 mL | INTRAVENOUS | Status: AC | PRN
Start: 2020-02-24 — End: 2020-02-24
  Administered 2020-02-24: 1 mL via INTRAVENOUS

## 2020-03-21 ENCOUNTER — Other Ambulatory Visit: Payer: Self-pay

## 2020-03-21 ENCOUNTER — Encounter: Payer: Self-pay | Admitting: Cardiology

## 2020-03-21 ENCOUNTER — Ambulatory Visit: Payer: BC Managed Care – PPO | Admitting: Cardiology

## 2020-03-21 VITALS — BP 140/90 | HR 88 | Ht 63.0 in | Wt 244.0 lb

## 2020-03-21 DIAGNOSIS — R0600 Dyspnea, unspecified: Secondary | ICD-10-CM

## 2020-03-21 DIAGNOSIS — R002 Palpitations: Secondary | ICD-10-CM | POA: Diagnosis not present

## 2020-03-21 DIAGNOSIS — Z8249 Family history of ischemic heart disease and other diseases of the circulatory system: Secondary | ICD-10-CM | POA: Diagnosis not present

## 2020-03-21 DIAGNOSIS — Z7189 Other specified counseling: Secondary | ICD-10-CM

## 2020-03-21 DIAGNOSIS — R0609 Other forms of dyspnea: Secondary | ICD-10-CM

## 2020-03-21 DIAGNOSIS — Z712 Person consulting for explanation of examination or test findings: Secondary | ICD-10-CM

## 2020-03-21 NOTE — Progress Notes (Signed)
Cardiology Office Note:    Date:  03/21/2020   ID:  Lindsey Boone, DOB 09/26/1970, MRN [id number]  PCP:  Leighton Ruff, MD  Cardiologist:  Buford Dresser, MD  Referring MD: Leighton Ruff, MD   CC: follow up  History of Present Illness:    Lindsey Boone is a 49 y.o. female with a hx of mitral valve prolapse, hypothyroidism, hypertension, prior PE who is seen for follow up today. I initially met her 02/01/20 as a new consult at the request of Leighton Ruff, MD for the evaluation and management of palpitations.  Tachycardia/palpitations: -Initial onset: has noticed for several years, but recently worse -Frequency/Duration: 3-4 days/week on average. Some days it never happens, some days it happens all day -Associated symptoms: lightheadedness, short of breath -Aggravating/alleviating factors: worse with caffeine. Gets out of breath getting out of the shower. -Prior cardiac history: told she had mitral valve prolapse -Prior workup: reports ~2005 wore a heart monitor for palpitations, told nothing high risk -Comorbidities: long term history of leg swelling, even when she was thin in her 75s, and this was the original reason for her echo -Family history: doesn't know much about her father's side. Mat Gpa died of MI at age 68. Mat Gma without heart issues. Mother (also my patient) without clear cardiac etiology for her symptoms. Brother is healthy, kids are healthy.  Today: No significant changes since last visit.   Reviewed monitor and echo, including actual report of monitor. She continues to have symptoms of palpitations and shortness of breath with exertion. Reviewed echo was normal, no evidence of MVP on these images. Discussed possible noncardiac etiology of her symptoms.  Past Medical History:  Diagnosis Date  . Abdominal distension   . Abdominal wall hernia   . Anemia, iron deficiency 07/05/2014  . Clotting disorder ([place])   . Edema   . H/O hiatal hernia    at  birth  repaired as a baby.   Marland Kitchen Hx of pulmonary embolus 10/03/2001  . Hyperlipemia   . Hypothyroid   . Iron deficiency anemia, unspecified   . Leg swelling   . Migraine   . Nausea   . Shortness of breath    with exertion     Past Surgical History:  Procedure Laterality Date  . CESAREAN SECTION  08/20/96  . CHOLECYSTECTOMY  09/2001  . HERNIA REPAIR  08/28/2011  . UMBILICAL HERNIA REPAIR     w/o obst or gangrene  . VENTRAL HERNIA REPAIR  08/28/2011   Procedure: HERNIA REPAIR VENTRAL ADULT;  Surgeon: Imogene Burn. Georgette Dover, MD;  Location: [facility];  Service: General;  Laterality: N/A;  Open Ventral Hernia Repair with Mesh    Current Medications: Current Outpatient Medications on File Prior to Visit  Medication Sig  . ARMOUR THYROID 90 MG tablet Take 90 mg by mouth daily.   Marland Kitchen aspirin 81 MG tablet Take 81 mg by mouth daily.  . hydrochlorothiazide (HYDRODIURIL) 25 MG tablet Take 25 mg by mouth daily.  Marland Kitchen HYDROcodone-acetaminophen (NORCO/VICODIN) 5-325 MG tablet Take 1 tablet by mouth every 4 (four) hours as needed.  Marland Kitchen ibuprofen (ADVIL,MOTRIN) 800 MG tablet Take 1 tablet (800 mg total) by mouth every 8 (eight) hours as needed for mild pain.  . iron polysaccharides (NIFEREX) 150 MG capsule Take 1 capsule (150 mg total) by mouth daily.  . methocarbamol (ROBAXIN) 500 MG tablet Take 1 tablet (500 mg total) by mouth every 8 (eight) hours as needed for muscle spasms.   No current  facility-administered medications on file prior to visit.     Allergies:   Amoxicillin and Sulfa antibiotics   Social History   Tobacco Use  . Smoking status: Never Smoker  . Smokeless tobacco: Never Used  Substance Use Topics  . Alcohol use: No    Alcohol/week: 0.0 standard drinks  . Drug use: No    Family History: doesn't know much about her father's side. Mat Gpa died of MI at age 71. Mat Gma without heart issues. Mother (also my patient) without clear cardiac etiology for her symptoms. Brother is healthy, kids are  healthy.  ROS:   Please see the history of present illness.  Additional pertinent ROS otherwise unremarkable  EKGs/Labs/Other Studies Reviewed:    The following studies were reviewed today: Monitor 03/21/20 7 days of data recorded on Zio monitor. Patient had a min HR of 52 bpm, max HR of 164 bpm, and avg HR of 82 bpm. Predominant underlying rhythm was Sinus Rhythm. No SVT, atrial fibrillation, high degree block, or pauses noted. Single episode of possible NSVT (11 beats) noted. Isolated atrial ectopy was occasional (2.2%) and ventricular ectopy was rare (<1%). There were 3 triggered events. These were sinus rhythm with a PAC.  Echo 02/24/20 1. Left ventricular ejection fraction, by estimation, is 60 to 65%. The  left ventricle has normal function. The left ventricle has no regional  wall motion abnormalities. Left ventricular diastolic parameters were  normal.  2. Right ventricular systolic function is normal. The right ventricular  size is normal.  3. The mitral valve is normal in structure. Mild mitral valve  regurgitation. No evidence of mitral stenosis.  4. The aortic valve is normal in structure. Aortic valve regurgitation is  not visualized. No aortic stenosis is present.  5. The inferior vena cava is normal in size with greater than 50%  respiratory variability, suggesting right atrial pressure of 3 mmHg.   EKG:  EKG is personally reviewed.  The ekg ordered today demonstrates Sinus rhythm with PAC, HR 86 bpm  Recent Labs: No results found for requested labs within last 8760 hours.  Recent Lipid Panel No results found for: CHOL, TRIG, HDL, CHOLHDL, VLDL, LDLCALC, LDLDIRECT  Physical Exam:    VS:  BP 140/90 (BP Location: Left Arm, Patient Position: Sitting, Cuff Size: Large)   Pulse 88   Ht '5\' 3"'  (1.6 m)   Wt 244 lb (110.7 kg)   BMI 43.22 kg/m     Wt Readings from Last 3 Encounters:  03/21/20 244 lb (110.7 kg)  02/01/20 240 lb 9.6 oz (109.1 kg)  09/06/17 240 lb  (108.9 kg)    GEN: Well nourished, well developed in no acute distress HEENT: Normal, moist mucous membranes NECK: No JVD CARDIAC: regular rhythm, normal S1 and S2, no rubs or gallops. No murmur. VASCULAR: Radial and DP pulses 2+ bilaterally. No carotid bruits RESPIRATORY:  Clear to auscultation without rales, wheezing or rhonchi  ABDOMEN: Soft, non-tender, non-distended MUSCULOSKELETAL:  Ambulates independently SKIN: Warm and dry, trivial bilateral LE edema NEUROLOGIC:  Alert and oriented x 3. No focal neuro deficits noted. PSYCHIATRIC:  Normal affect   ASSESSMENT:    1. Encounter to discuss test results   2. Heart palpitations   3. Family history of heart disease   4. Dyspnea on exertion   5. Counseling on health promotion and disease prevention    PLAN:    Palpitations, history of mitral valve prolapse, dyspnea on exertion, chronic bilateral LE edema: -echo without evidence of  MVP, no structural issues -monitor with occasional ectopy, but triggered events were sinus with a PAC -discussed potential non-cardiac etiologies of symptoms -reviewed red flag warning signs that need immediate medical attention  Family history of heart disease Cardiac risk counseling and prevention recommendations: -recommend heart healthy/Mediterranean diet, with whole grains, fruits, vegetable, fish, lean meats, nuts, and olive oil. Limit salt. -recommend moderate walking, 3-5 times/week for 30-50 minutes each session. Aim for at least 150 minutes.week. Goal should be pace of 3 miles/hours, or walking 1.5 miles in 30 minutes -recommend avoidance of tobacco products. Avoid excess alcohol. -ASCVD risk score: The ASCVD Risk score Mikey Bussing DC Jr., et al., 2013) failed to calculate for the following reasons:   Cannot find a previous HDL lab   Cannot find a previous total cholesterol lab   -lipids per [facility] 01/13/2019: Tchol 156, HDL 37, LDL 94, TG 126 -does not need aspirin from a primary prevention  standpoint -was continued on after completing anticoagulation in ~2000  Plan for follow up: I would be happy to see her back as needed  Buford Dresser, MD, PhD [place]  [facility]    Medication Adjustments/Labs and Tests Ordered: Current medicines are reviewed at length with the patient today.  Concerns regarding medicines are outlined above.  No orders of the defined types were placed in this encounter.  No orders of the defined types were placed in this encounter.   Patient Instructions  Medication Instructions:  Your Physician recommend you continue on your current medication as directed.    *If you need a refill on your cardiac medications before your next appointment, please call your pharmacy*   Lab Work: None   Testing/Procedures: None   Follow-Up: At [facility], you and your health needs are our priority.  As part of our continuing mission to provide you with exceptional heart care, we have created designated Provider Care Teams.  These Care Teams include your primary Cardiologist (physician) and Advanced Practice Providers (APPs -  Physician Assistants and Nurse Practitioners) who all work together to provide you with the care you need, when you need it.  We recommend signing up for the patient portal called "MyChart".  Sign up information is provided on this After Visit Summary.  MyChart is used to connect with patients for Virtual Visits (Telemedicine).  Patients are able to view lab/test results, encounter notes, upcoming appointments, etc.  Non-urgent messages can be sent to your provider as well.   To learn more about what you can do with MyChart, go to [email or web address].    Your next appointment:   As needed  The format for your next appointment:   In Person  Provider:   Buford Dresser, MD              Signed, Buford Dresser, MD PhD 03/21/2020    [place]

## 2020-03-21 NOTE — Patient Instructions (Addendum)

## 2020-07-10 ENCOUNTER — Encounter: Payer: Self-pay | Admitting: Cardiology

## 2021-04-03 ENCOUNTER — Other Ambulatory Visit: Payer: Self-pay | Admitting: Obstetrics and Gynecology

## 2021-04-03 DIAGNOSIS — R928 Other abnormal and inconclusive findings on diagnostic imaging of breast: Secondary | ICD-10-CM

## 2021-04-21 ENCOUNTER — Telehealth: Payer: BC Managed Care – PPO | Admitting: Physician Assistant

## 2021-04-21 DIAGNOSIS — J019 Acute sinusitis, unspecified: Secondary | ICD-10-CM | POA: Diagnosis not present

## 2021-04-21 DIAGNOSIS — B9689 Other specified bacterial agents as the cause of diseases classified elsewhere: Secondary | ICD-10-CM | POA: Diagnosis not present

## 2021-04-21 MED ORDER — DOXYCYCLINE HYCLATE 100 MG PO TABS: 100.0000 mg | ORAL_TABLET | Freq: Two times a day (BID) | ORAL | 0 refills | Status: DC

## 2021-04-21 NOTE — Progress Notes (Signed)

## 2021-05-08 ENCOUNTER — Other Ambulatory Visit: Payer: Self-pay

## 2021-05-08 ENCOUNTER — Ambulatory Visit: Payer: BC Managed Care – PPO

## 2021-05-08 ENCOUNTER — Ambulatory Visit
Admission: RE | Admit: 2021-05-08 | Discharge: 2021-05-08 | Disposition: A | Discharge status: Home / Self Care | Payer: BC Managed Care – PPO | Source: Ambulatory Visit | Attending: Obstetrics and Gynecology | Admitting: Obstetrics and Gynecology

## 2021-05-08 ENCOUNTER — Other Ambulatory Visit: Payer: Self-pay | Admitting: Obstetrics and Gynecology

## 2021-05-08 DIAGNOSIS — N6310 Unspecified lump in the right breast, unspecified quadrant: Secondary | ICD-10-CM

## 2021-05-08 DIAGNOSIS — R928 Other abnormal and inconclusive findings on diagnostic imaging of breast: Secondary | ICD-10-CM

## 2022-05-30 ENCOUNTER — Other Ambulatory Visit: Payer: Self-pay | Admitting: Obstetrics and Gynecology

## 2022-05-30 DIAGNOSIS — R928 Other abnormal and inconclusive findings on diagnostic imaging of breast: Secondary | ICD-10-CM

## 2022-06-08 ENCOUNTER — Ambulatory Visit
Admission: RE | Admit: 2022-06-08 | Discharge: 2022-06-08 | Disposition: A | Discharge status: Home / Self Care | Payer: BC Managed Care – PPO | Source: Ambulatory Visit | Attending: Obstetrics and Gynecology | Admitting: Obstetrics and Gynecology

## 2022-06-08 DIAGNOSIS — R928 Other abnormal and inconclusive findings on diagnostic imaging of breast: Secondary | ICD-10-CM

## 2023-01-03 ENCOUNTER — Other Ambulatory Visit: Payer: Self-pay | Admitting: *Deleted

## 2023-01-03 DIAGNOSIS — M7989 Other specified soft tissue disorders: Secondary | ICD-10-CM

## 2023-01-11 ENCOUNTER — Ambulatory Visit (INDEPENDENT_AMBULATORY_CARE_PROVIDER_SITE_OTHER): Payer: 59 | Admitting: Vascular Surgery

## 2023-01-11 ENCOUNTER — Ambulatory Visit (HOSPITAL_COMMUNITY)
Admission: RE | Admit: 2023-01-11 | Discharge: 2023-01-11 | Disposition: A | Discharge status: Home / Self Care | Payer: 59 | Source: Ambulatory Visit | Attending: Vascular Surgery

## 2023-01-11 ENCOUNTER — Encounter: Payer: Self-pay | Admitting: Vascular Surgery

## 2023-01-11 VITALS — BP 136/92 | HR 89 | Temp 98.3°F | Resp 20 | Ht 63.0 in | Wt 255.0 lb

## 2023-01-11 DIAGNOSIS — M7989 Other specified soft tissue disorders: Secondary | ICD-10-CM | POA: Insufficient documentation

## 2023-01-11 DIAGNOSIS — I890 Lymphedema, not elsewhere classified: Secondary | ICD-10-CM

## 2023-01-11 DIAGNOSIS — R609 Edema, unspecified: Secondary | ICD-10-CM | POA: Diagnosis not present

## 2023-01-11 NOTE — Progress Notes (Unsigned)
Office Note     CC:  Lymphedema R>L Requesting Provider:  Jarrett Soho, PA-C  HPI: Lindsey Boone is a 52 y.o. ([birth year]) female presenting at the request of .Juluis Rainier, MD (Inactive) for bilateral lower extremity swelling and edema which is worse by days end.  On exam, Lindsey Boone was doing well.  A native of [place], she moved to [place] years ago.  She has 3 children the youngest being 10, and 1 grandchild who is 4.  She continues to work full-time, and is able to do this remotely.  Lindsey Boone has always had difficulty with her weight.  This worsened with menopause.  Over the last several months, she is appreciated swelling and heaviness by days in bilateral lower extremities, but worse on the right.  She appreciates the difference in her ankle.  Legs are smallest in the morning, and largest in the evening.She denies varicosities, telangiectasias.  No bleeding events.  No significant color changes.  She has not worn compression stockings.  No previous history of venous or vascular surgery.  No prior history of DVT    Past Medical History:  Diagnosis Date   Abdominal distension    Abdominal wall hernia    Anemia, iron deficiency 07/05/2014   Clotting disorder ([facility])    Edema    H/O hiatal hernia    at birth  repaired as a baby.    Hx of pulmonary embolus 10/03/2001   Hyperlipemia    Hypothyroid    Iron deficiency anemia, unspecified    Leg swelling    Migraine    Nausea    Shortness of breath    with exertion     Past Surgical History:  Procedure Laterality Date   CESAREAN SECTION  08/20/96   CHOLECYSTECTOMY  09/2001   HERNIA REPAIR  08/28/2011   UMBILICAL HERNIA REPAIR     w/o obst or gangrene   VENTRAL HERNIA REPAIR  08/28/2011   Procedure: HERNIA REPAIR VENTRAL ADULT;  Surgeon: [name]. [name], MD;  Location: [facility];  Service: General;  Laterality: N/A;  Open Ventral Hernia Repair with Mesh    Social History   Socioeconomic History   Marital status: Married     Spouse name: Not on file   Number of children: Not on file   Years of education: Not on file   Highest education level: Not on file  Occupational History   Not on file  Tobacco Use   Smoking status: Never   Smokeless tobacco: Never  Substance and Sexual Activity   Alcohol use: No    Alcohol/week: 0.0 standard drinks of alcohol   Drug use: No   Sexual activity: Not on file  Other Topics Concern   Not on file  Social History Narrative   Not on file   Social Determinants of Health   Financial Resource Strain: Not on file  Food Insecurity: Not on file  Transportation Needs: Not on file  Physical Activity: Not on file  Stress: Not on file  Social Connections: Not on file  Intimate Partner Violence: Not on file   History reviewed. No pertinent family history.  Current Outpatient Medications  Medication Sig Dispense Refill   ARMOUR THYROID 120 MG tablet Take 120 mg by mouth daily. Take with 30mg  for total dosage of 150mg      ARMOUR THYROID 30 MG tablet Take 30 mg by mouth daily.     Cholecalciferol 50 MCG (2000 UT) TABS 1 tablet Orally occasionally for 30 days  ibuprofen (ADVIL,MOTRIN) 800 MG tablet Take 1 tablet (800 mg total) by mouth every 8 (eight) hours as needed for mild pain. 30 tablet 0   losartan-hydrochlorothiazide (HYZAAR) 50-12.5 MG tablet Take 1 tablet by mouth daily.     No current facility-administered medications for this visit.    Allergies  Allergen Reactions   Amoxicillin    Sulfa Antibiotics Hives    Upper torso only.     REVIEW OF SYSTEMS:   [X]  denotes positive finding, [ ]  denotes negative finding Cardiac  Comments:  Chest pain or chest pressure:    Shortness of breath upon exertion:    Short of breath when lying flat:    Irregular heart rhythm:        Vascular    Pain in calf, thigh, or hip brought on by ambulation:    Pain in feet at night that wakes you up from your sleep:     Blood clot in your veins:    Leg swelling:  X        Pulmonary    Oxygen at home:    Productive cough:     Wheezing:         Neurologic    Sudden weakness in arms or legs:     Sudden numbness in arms or legs:     Sudden onset of difficulty speaking or slurred speech:    Temporary loss of vision in one eye:     Problems with dizziness:         Gastrointestinal    Blood in stool:     Vomited blood:         Genitourinary    Burning when urinating:     Blood in urine:        Psychiatric    Major depression:         Hematologic    Bleeding problems:    Problems with blood clotting too easily:        Skin    Rashes or ulcers:        Constitutional    Fever or chills:      PHYSICAL EXAMINATION:  Vitals:   01/11/23 1442  BP: (!) 136/92  Pulse: 89  Resp: 20  Temp: 98.3 F (36.8 C)  SpO2: 97%  Weight: 255 lb (115.7 kg)  Height: 5\' 3"  (1.6 m)    General:  WDWN in NAD; vital signs documented above Gait: Not observed HENT: WNL, normocephalic Pulmonary: normal non-labored breathing , without wheezing Cardiac: regular HR Abdomen: soft, NT, no masses Skin: without rashes Vascular Exam/Pulses:  Right Left  Radial 2+ (normal) 2+ (normal)  Ulnar    Femoral    Popliteal    DP    PT     Extremities: without ischemic changes, without Gangrene , without cellulitis; without open wounds;  Significant adipose deposition, 1+ pitting edema extending onto the forefoot and involving the toes Musculoskeletal: no muscle wasting or atrophy  Neurologic: A&O X 3;  No focal weakness or paresthesias are detected Psychiatric:  The pt has Normal affect.   Non-Invasive Vascular Imaging:   Mild, focal venous insufficiency appreciated at the saphenofemoral junction    ASSESSMENT/PLAN: Lindsey Boone is a 52 y.o. female presenting with lower extremity heaviness with accompanying swelling.  Venous duplex ultrasound was reviewed demonstrating very little venous reflux.  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  I had a long conversation with her  regarding the above, namely treatment options moving forward.  She is aware that I am not worried about her in the coming months, however over the next decade, if she is unable to lose weight there is a high probability that the lymphedema she has will worsen, and will fibrose leading to chronic skin changes and nonreversible enlargement.  Lindsey Boone would benefit most from bariatric surgery to promote significant weight loss.  We discussed the role of compression wraps, including sequential pneumatic compression and manual lymphatic decongestion, however with her amount of adipose tissue, I think that we will only provide a small amount of benefit, and would not treat the underlying problem. I appreciated her willingness to listen.  Per Lindsey Boone, her husband also also large, and would benefit from weight loss.  I recommended they discuss our visit today, and gave them materials for bariatric surgery consultation.    Victorino Sparrow, MD [facility] [phone] Total time of patient care including pre-visit research, consultation, and documentation greater than 45 minutes

## 2023-06-06 ENCOUNTER — Other Ambulatory Visit: Payer: Self-pay | Admitting: Obstetrics and Gynecology

## 2023-06-06 DIAGNOSIS — R928 Other abnormal and inconclusive findings on diagnostic imaging of breast: Secondary | ICD-10-CM

## 2023-06-24 ENCOUNTER — Ambulatory Visit
Admission: RE | Admit: 2023-06-24 | Discharge: 2023-06-24 | Disposition: A | Discharge status: Home / Self Care | Payer: 59 | Source: Ambulatory Visit | Attending: Obstetrics and Gynecology | Admitting: Obstetrics and Gynecology

## 2023-06-24 ENCOUNTER — Other Ambulatory Visit: Payer: Self-pay | Admitting: Obstetrics and Gynecology

## 2023-06-24 DIAGNOSIS — R928 Other abnormal and inconclusive findings on diagnostic imaging of breast: Secondary | ICD-10-CM

## 2023-06-24 DIAGNOSIS — R921 Mammographic calcification found on diagnostic imaging of breast: Secondary | ICD-10-CM

## 2023-12-06 ENCOUNTER — Encounter: Payer: Self-pay | Admitting: Cardiology

## 2023-12-06 ENCOUNTER — Ambulatory Visit: Attending: Cardiology | Admitting: Cardiology

## 2023-12-06 ENCOUNTER — Ambulatory Visit: Payer: Self-pay | Admitting: Cardiology

## 2023-12-06 ENCOUNTER — Ambulatory Visit (INDEPENDENT_AMBULATORY_CARE_PROVIDER_SITE_OTHER)

## 2023-12-06 VITALS — BP 134/86 | HR 125 | Ht 62.0 in | Wt 242.8 lb

## 2023-12-06 DIAGNOSIS — R9431 Abnormal electrocardiogram [ECG] [EKG]: Secondary | ICD-10-CM | POA: Diagnosis not present

## 2023-12-06 DIAGNOSIS — R000 Tachycardia, unspecified: Secondary | ICD-10-CM

## 2023-12-06 DIAGNOSIS — R0609 Other forms of dyspnea: Secondary | ICD-10-CM | POA: Diagnosis not present

## 2023-12-06 DIAGNOSIS — R079 Chest pain, unspecified: Secondary | ICD-10-CM

## 2023-12-06 LAB — D-DIMER, QUANTITATIVE: D-DIMER: <0.2 mg{FEU}/L (ref 0.00–0.49)

## 2023-12-06 NOTE — Progress Notes (Unsigned)
 Applied a 7 day Zio XT monitor to patient in the office

## 2023-12-06 NOTE — Progress Notes (Signed)
 Cardiology Office Note:  .   Date:  12/06/2023  ID:  Lindsey Boone, DOB Nov 03, 1970, MRN [id number] PCP: Gwenn Norris, MD   HeartCare Providers Cardiologist:  Newman Lawrence, MD PCP: Gwenn Norris, MD  Chief Complaint  Patient presents with   Abnormal ECG     Lindsey Boone is a 53 y.o. female with h/o unprovoked PE in 2003, now referred for abnormal EKG   Discussed the use of AI scribe software for clinical note transcription with the patient, who gave verbal consent to proceed.  History of Present Illness Lindsey Boone is a 53 year old female with a history of unprovoked pulmonary embolism who presents with chest tightness and shortness of breath. She was evaluated by Dr. Justine Medal for her history of pulmonary embolism.  She experiences chest tightness and shortness of breath for several months, primarily when standing or walking, with relief at rest or when sitting. Emotional stress can also trigger these symptoms. She has a consistently rapid heart rate, often perceivable in her neck. Home monitoring showed a recent blood pressure of 188/148 mmHg with a pulse of 95 bpm.  Two weeks ago, she experienced severe indigestion and heartburn after dining out, lasting four days, and attempted relief with Gaviscon, Tums, and milk.  Her medical history includes hypertension, managed with losartan and hydrochlorothiazide, and hypothyroidism, treated with thyroid  supplements. She denies any family history of heart disease, does not smoke or consume alcohol, and has minimal caffeine intake. She acknowledges a baseline level of anxiety.      Vitals:   12/06/23 1442  BP: 134/86  Pulse: (!) 125  SpO2: 98%      Review of Systems  Cardiovascular:  Positive for chest pain and dyspnea on exertion. Negative for leg swelling, palpitations and syncope.        Studies Reviewed: SABRA       EKG 12/06/2023: Sinus tachycardia When compared with ECG of 06-Sep-2017  04:35,  rate is faster    Labs 1-10/2023: Chol 162, TG 206, HDL 41, LDL 86 HbA1C 5.4% Hb 13.5 Cr 0.69 TSH 0.5   Physical Exam Vitals and nursing note reviewed.  Constitutional:      General: She is not in acute distress. Neck:     Vascular: No JVD.  Cardiovascular:     Rate and Rhythm: Regular rhythm. Tachycardia present.     Heart sounds: Normal heart sounds. No murmur heard. Pulmonary:     Effort: Pulmonary effort is normal.     Breath sounds: Normal breath sounds. No wheezing or rales.  Musculoskeletal:     Right lower leg: No edema.     Left lower leg: No edema.      VISIT DIAGNOSES:   ICD-10-CM   1. Abnormal EKG  R94.31 EKG 12-Lead    2. Sinus tachycardia  R00.0 D-Dimer, Quantitative    LONG TERM MONITOR (3-14 DAYS)    ECHOCARDIOGRAM COMPLETE    3. Exertional chest pain  R07.9     4. Exertional dyspnea  R06.09        Lindsey Boone is a 53 y.o. female with  h/o unprovoked PE in 2003, now referred for abnormal EKG  Assessment & Plan Exertional chest tightness and shortness of breath: Exertional chest tightness and shortness of breath during physical activity or emotional distress. No myocardial infarction on EKG. No coronary artery disease risk factors. Hypertension noted, possibly anxiety-related. Planned heart rate control before further coronary artery disease testing. - Order echocardiogram. -  Provide heart rate monitor for one week. - Consider stress test or CT coronary angiography after heart rate control.  Sinus tachycardia: Longstanding history of sinus tachycardia with no clear cause.  She has had prior unprovoked PE, has not been on anticoagulation for many years.  Will check echocardiogram and D-dimer.  If D-dimer is elevated, will obtain CTA to rule out PE. Also, recommend 1-week ZIO monitor to assess average heart rate.  - Consider metoprolol if patient calls for sinus tachycardia noted.  - Consider holding off initiation of weight loss  medications until further cardiac workup completed.  Hypertension: Elevated blood pressure, possibly anxiety-related. Current medications include losartan and hydrochlorothiazide. Planned blood pressure management in conjunction with heart rate control. - Consider metoprolol to assist with blood pressure if heart rate management is needed.    F/u in 6-8   Signed, Newman JINNY Lawrence, MD

## 2023-12-06 NOTE — Patient Instructions (Signed)
  Lab Work: D-dimer STAT  If you have labs (blood work) drawn today and your tests are completely normal, you will receive your results only by: MyChart Message (if you have MyChart) OR A paper copy in the mail If you have any lab test that is abnormal or we need to change your treatment, we will call you to review the results.  Testing/Procedures: ECHO  Your physician has requested that you have an echocardiogram. Echocardiography is a painless test that uses sound waves to create images of your heart. It provides your doctor with information about the size and shape of your heart and how well your heart's chambers and valves are working. This procedure takes approximately one hour. There are no restrictions for this procedure. Please do NOT wear cologne, perfume, aftershave, or lotions (deodorant is allowed). Please arrive 15 minutes prior to your appointment time.  Please note: We ask at that you not bring children with you during ultrasound (echo/ vascular) testing. Due to room size and safety concerns, children are not allowed in the ultrasound rooms during exams. Our front office staff cannot provide observation of children in our lobby area while testing is being conducted. An adult accompanying a patient to their appointment will only be allowed in the ultrasound room at the discretion of the ultrasound technician under special circumstances. We apologize for any inconvenience.  1 WEEK ZIO PATCH   Your physician has requested that you wear a Zio heart monitor for __7___ days. This will be mailed to your home with instructions on how to apply the monitor and how to return it when finished. Please allow 2 weeks after returning the heart monitor before our office calls you with the results.   Follow-Up: At [facility], you and your health needs are our priority.  As part of our continuing mission to provide you with exceptional heart care, our providers are all part of one team.   This team includes your primary Cardiologist (physician) and Advanced Practice Providers or APPs (Physician Assistants and Nurse Practitioners) who all work together to provide you with the care you need, when you need it.  Your next appointment:   6-8 week(s)  Provider:   Newman JINNY Lawrence, MD

## 2023-12-07 ENCOUNTER — Encounter: Payer: Self-pay | Admitting: Cardiology

## 2023-12-07 DIAGNOSIS — R079 Chest pain, unspecified: Secondary | ICD-10-CM | POA: Insufficient documentation

## 2023-12-07 DIAGNOSIS — R000 Tachycardia, unspecified: Secondary | ICD-10-CM | POA: Insufficient documentation

## 2023-12-07 DIAGNOSIS — R9431 Abnormal electrocardiogram [ECG] [EKG]: Secondary | ICD-10-CM | POA: Insufficient documentation

## 2023-12-07 DIAGNOSIS — R0609 Other forms of dyspnea: Secondary | ICD-10-CM | POA: Insufficient documentation

## 2023-12-20 DIAGNOSIS — R000 Tachycardia, unspecified: Secondary | ICD-10-CM

## 2023-12-20 NOTE — Progress Notes (Signed)
 No significant anemia noted.  Symptoms correlated with extra beat that occurred about 2% of the time.  If symptoms are severe and persistent, could consider adding metoprolol tartrate 25 mg twice daily.  Thanks MJP

## 2024-01-03 ENCOUNTER — Encounter

## 2024-01-03 ENCOUNTER — Ambulatory Visit
Admission: RE | Admit: 2024-01-03 | Discharge: 2024-01-03 | Disposition: A | Discharge status: Home / Self Care | Source: Ambulatory Visit | Attending: Obstetrics and Gynecology | Admitting: Obstetrics and Gynecology

## 2024-01-03 DIAGNOSIS — R928 Other abnormal and inconclusive findings on diagnostic imaging of breast: Secondary | ICD-10-CM

## 2024-01-03 DIAGNOSIS — R921 Mammographic calcification found on diagnostic imaging of breast: Secondary | ICD-10-CM

## 2024-01-14 ENCOUNTER — Ambulatory Visit (HOSPITAL_COMMUNITY)
Admission: RE | Admit: 2024-01-14 | Discharge: 2024-01-14 | Disposition: A | Discharge status: Home / Self Care | Source: Ambulatory Visit | Attending: Cardiovascular Disease | Admitting: Cardiovascular Disease

## 2024-01-14 DIAGNOSIS — R000 Tachycardia, unspecified: Secondary | ICD-10-CM | POA: Diagnosis present

## 2024-01-14 LAB — ECHOCARDIOGRAM COMPLETE
Area-P 1/2: 3.65 cm2
S' Lateral: 2.1 cm

## 2024-01-14 MED ORDER — PERFLUTREN LIPID MICROSPHERE
1.0000 mL | INTRAVENOUS | Status: AC | PRN
  Administered 2024-01-14: 2 mL via INTRAVENOUS

## 2024-01-15 NOTE — Progress Notes (Signed)
 Cardiology Office Note:  .   Date:  01/15/2024  ID:  Lindsey Boone, DOB 06-Jun-1970, MRN [id number] PCP: Gwenn Norris, MD  [facility] Providers Cardiologist:  Newman Lawrence, MD PCP: Gwenn Norris, MD  Chief Complaint  Patient presents with   Palpitations     Lindsey Boone is a 53 y.o. female with h/o unprovoked PE in 2003, symptomatic PACs  Discussed the use of AI scribe software for clinical note transcription with the patient, who gave verbal consent to proceed.  History of Present Illness Reviewed recent echocardiogram and monitor results with the patient.  She has had improvement in her symptoms, but still has occasional palpitation symptoms.      Vitals:   01/28/24 1439  BP: 113/78  Pulse: (!) 104  SpO2: 98%       Review of Systems  Cardiovascular:  Positive for palpitations. Negative for chest pain, dyspnea on exertion, leg swelling and syncope.        Studies Reviewed: SABRA       EKG 12/06/2023: Sinus tachycardia When compared with ECG of 06-Sep-2017 04:35,  rate is faster    Echocardiogram 12/2023:  1. Left ventricular ejection fraction, by estimation, is 65 to 70%. The  left ventricle has hyperdynamic function. The left ventricle has no  regional wall motion abnormalities. There is mild concentric left  ventricular hypertrophy. Left ventricular  diastolic parameters were normal. There is a left ventricular mid-cavity  gradient peak 31 mmHg with Valsalva.   2. Right ventricular systolic function is normal. The right ventricular  size is normal. Tricuspid regurgitation signal is inadequate for assessing  PA pressure.   3. The mitral valve is normal in structure. No evidence of mitral valve  regurgitation. No evidence of mitral stenosis.   4. The aortic valve is tricuspid. Aortic valve regurgitation is not  visualized. No aortic stenosis is present.   5. The inferior vena cava is normal in size with greater than 50%  respiratory  variability, suggesting right atrial pressure of 3 mmHg.   Zio patch monitor 7 days 12/06/2023 - 12/13/2023: Dominant rhythm: Sinus. HR 59-151 bpm. Avg HR 92 bpm, in sinus rhythm. 1 episodes of atrial tachycardia at 200 bpm for 4 beats. 2.2% isolated SVE, <1% couplet/triplets. No atrial fibrillation/atrial flutter/VT/high grade AV block, sinus pause >3sec noted. 25 patient triggered events, correlated with SVE.    Labs 1-10/2023: Chol 162, TG 206, HDL 41, LDL 86 HbA1C 5.4% Hb 13.5 Cr 0.69 TSH 0.5   Physical Exam Vitals and nursing note reviewed.  Constitutional:      General: She is not in acute distress. Neck:     Vascular: No JVD.  Cardiovascular:     Rate and Rhythm: Regular rhythm. Tachycardia present.     Heart sounds: Normal heart sounds. No murmur heard. Pulmonary:     Effort: Pulmonary effort is normal.     Breath sounds: Normal breath sounds. No wheezing or rales.  Musculoskeletal:     Right lower leg: No edema.     Left lower leg: No edema.      VISIT DIAGNOSES:   ICD-10-CM   1. Precordial pain  R07.2     2. Premature atrial contraction  I49.1     3. Palpitations  R00.2         Lindsey Boone is a 53 y.o. female with  h/o unprovoked PE in 2003, symptomatic PACs Assessment & Plan Palpitations: Symptomatic SVT. Reassuring echocardiogram. Possibly as a cause for  her intermittent chest pain. Started Metropol tartrate 25 mg twice daily. Encourage liberal hydration. If patient experiences lightheadedness or drop in blood pressure, consider reducing losartan-hydrochlorothiazide dose in half.  Hypertension: Controlled.       F/u in 3 months w/APP  Signed, Newman JINNY Lawrence, MD

## 2024-01-28 ENCOUNTER — Encounter: Payer: Self-pay | Admitting: Cardiology

## 2024-01-28 ENCOUNTER — Ambulatory Visit: Attending: Cardiology | Admitting: Cardiology

## 2024-01-28 VITALS — BP 113/78 | HR 104 | Ht 63.0 in | Wt 237.0 lb

## 2024-01-28 DIAGNOSIS — I491 Atrial premature depolarization: Secondary | ICD-10-CM | POA: Insufficient documentation

## 2024-01-28 DIAGNOSIS — R002 Palpitations: Secondary | ICD-10-CM | POA: Insufficient documentation

## 2024-01-28 DIAGNOSIS — R072 Precordial pain: Secondary | ICD-10-CM | POA: Insufficient documentation

## 2024-01-28 MED ORDER — METOPROLOL TARTRATE 25 MG PO TABS
25.0000 mg | ORAL_TABLET | Freq: Two times a day (BID) | ORAL | 3 refills | Status: AC
Start: 2024-01-28 — End: 2024-04-27

## 2024-01-28 NOTE — Patient Instructions (Signed)
 Medication Instructions:  START Metoprolol Tartrate 25 mg twice daily   *If you need a refill on your cardiac medications before your next appointment, please call your pharmacy*   Follow-Up: At [facility], you and your health needs are our priority.  As part of our continuing mission to provide you with exceptional heart care, our providers are all part of one team.  This team includes your primary Cardiologist (physician) and Advanced Practice Providers or APPs (Physician Assistants and Nurse Practitioners) who all work together to provide you with the care you need, when you need it.  Your next appointment:   3 month(s)  Provider:   One of our Advanced Practice Providers (APPs): Morse Clause, PA-C  Lamarr Satterfield, NP Miriam Shams, NP  Olivia Pavy, PA-C Josefa Beauvais, NP  Leontine Salen, PA-C Orren Fabry, PA-C  [place], PA-C Ernest Dick, NP  Damien Braver, NP Jon Hails, PA-C  Waddell Donath, PA-C    Dayna Dunn, PA-C  Scott Weaver, PA-C Lum Louis, NP Katlyn West, NP Callie Goodrich, PA-C  Xika Zhao, NP Sheng Haley, PA-C    Kathleen Johnson, PA-C

## 2024-02-04 ENCOUNTER — Telehealth: Payer: Self-pay

## 2024-02-04 ENCOUNTER — Encounter: Payer: Self-pay | Admitting: Cardiology

## 2024-02-04 NOTE — Telephone Encounter (Signed)
Sent message to  My Chart .

## 2024-02-06 NOTE — Telephone Encounter (Signed)
 Please review and advise if there are concerns of interactions.

## 2024-04-14 NOTE — Progress Notes (Deleted)
°  Cardiology Office Note   Date:  04/14/2024  ID:  Lindsey Boone, DOB Feb 17, 1971, MRN [id number] PCP: Gwenn Norris, MD  [facility] Providers Cardiologist:  Newman JINNY Lawrence, MD   History of Present Illness Lindsey Boone is a 53 y.o. female with a past medical history of unprovoked PE in 2003, symptomatic PACs. Presents today for a follow up appointment   Patient previously wore a cardiac monitor in 01/2020 that showed NSR with an average HR of 82 BPM, 2.2% PAC burden. Echocardiogram at that time showed EF 60-65%, no regional wall motion abnormalities, normal RV systolic function, mild MR.   More recently, cardiac monitor in 11/2023 showed predominantly normal sinus rhythm with an average HR of 92 BPM, 2.2% PAC burden. Symptom triggered events correlated with PACs. Echocardiogram in 12/2023 showed EF 65-70%, mild LVH, an mid-cavity gradient peak 31 mmHg with Valsalva, normal RV systolic function, no significant valvular abnormalities. Started on metoprolol    Palpitations  PACs  - Cardiac monitor in 11/2023 showed predominantly normal sinus rhythm with an average HR of 92 BPM, 2.2% PAC burden  -  - Continue metoprolol  tartrate 25 mg BID   HTN  -  - Continue metoprolol  tartrate 25 mg BID, losartan-hydrochlorothiazide 100-25 mg daily - BMP ***    ROS: ***  Studies Reviewed      *** Risk Assessment/Calculations {Does this patient have ATRIAL FIBRILLATION?:[phone]} No BP recorded.  {Refresh Note OR Click here to enter BP  :1}***       Physical Exam VS:  There were no vitals taken for this visit.       Wt Readings from Last 3 Encounters:  01/28/24 237 lb (107.5 kg)  12/06/23 242 lb 12.8 oz (110.1 kg)  01/11/23 255 lb (115.7 kg)    GEN: Well nourished, well developed in no acute distress NECK: No JVD; No carotid bruits CARDIAC: ***RRR, no murmurs, rubs, gallops RESPIRATORY:  Clear to auscultation without rales, wheezing or rhonchi  ABDOMEN: Soft,  non-tender, non-distended EXTREMITIES:  No edema; No deformity   ASSESSMENT AND PLAN ***    {Are you ordering a CV Procedure (e.g. stress test, cath, DCCV, TEE, etc)?   Press F2        :[id number]}  Dispo: ***  Signed, Rollo FABIENE Louder, PA-C

## 2024-04-28 ENCOUNTER — Ambulatory Visit: Admitting: Cardiology

## 2024-05-14 NOTE — Progress Notes (Unsigned)
" °  Cardiology Office Note   Date:  05/14/2024  ID:  Lindsey Boone, DOB 1970-10-11, MRN [id number] PCP: Lindsey Norris, MD  [facility] Providers Cardiologist:  Newman JINNY Lawrence, MD   History of Present Illness Lindsey Boone is a 54 y.o. female with a past medical history of unprovoked PE in 2003, symptomatic PACs. Presents today for a follow up appointment   Patient previously wore a cardiac monitor in 01/2020 that showed NSR with an average HR of 82 BPM, 2.2% PAC burden. Echocardiogram at that time showed EF 60-65%, no regional wall motion abnormalities, normal RV systolic function, mild MR.   More recently, cardiac monitor in 11/2023 showed predominantly normal sinus rhythm with an average HR of 92 BPM, 2.2% PAC burden. Symptom triggered events correlated with PACs. Echocardiogram in 12/2023 showed EF 65-70%, mild LVH, an mid-cavity gradient peak 31 mmHg with Valsalva, normal RV systolic function, no significant valvular abnormalities. Started on metoprolol    Palpitations  PACs  - Cardiac monitor in 11/2023 showed predominantly normal sinus rhythm with an average HR of 92 BPM, 2.2% PAC burden  -  - Continue metoprolol  tartrate 25 mg BID   HTN  -  - Continue metoprolol  tartrate 25 mg BID, losartan-hydrochlorothiazide 100-25 mg daily - BMP ***    ROS: ***  Studies Reviewed      *** Risk Assessment/Calculations {Does this patient have ATRIAL FIBRILLATION?[phone]} No BP recorded.  {Refresh Note OR Click here to enter BP  :1}***       Physical Exam VS:  There were no vitals taken for this visit.       Wt Readings from Last 3 Encounters:  01/28/24 237 lb (107.5 kg)  12/06/23 242 lb 12.8 oz (110.1 kg)  01/11/23 255 lb (115.7 kg)    GEN: Well nourished, well developed in no acute distress NECK: No JVD; No carotid bruits CARDIAC: ***RRR, no murmurs, rubs, gallops RESPIRATORY:  Clear to auscultation without rales, wheezing or rhonchi  ABDOMEN: Soft,  non-tender, non-distended EXTREMITIES:  No edema; No deformity   ASSESSMENT AND PLAN ***    {Are you ordering a CV Procedure (e.g. stress test, cath, DCCV, TEE, etc)?   Press F2        :[id number]}  Dispo: ***  Signed, Rollo FABIENE Louder, PA-C   "

## 2024-05-27 ENCOUNTER — Ambulatory Visit: Admitting: Cardiology

## 2024-07-14 ENCOUNTER — Ambulatory Visit: Admitting: Cardiology
